# Patient Record
Sex: Female | Born: 1955
Health system: Southern US, Community
[De-identification: ages and names within clinical notes are randomized; demographics above are authoritative.]

## PROBLEM LIST (undated history)

## (undated) DIAGNOSIS — K219 Gastro-esophageal reflux disease without esophagitis: Secondary | ICD-10-CM

## (undated) DIAGNOSIS — T7840XA Allergy, unspecified, initial encounter: Secondary | ICD-10-CM

## (undated) HISTORY — DX: Allergy, unspecified, initial encounter: T78.40XA

## (undated) HISTORY — PX: TUBAL LIGATION: SHX77

## (undated) HISTORY — PX: JOINT REPLACEMENT: SHX530

## (undated) HISTORY — PX: TONSILLECTOMY: SUR1361

## (undated) HISTORY — DX: Gastro-esophageal reflux disease without esophagitis: K21.9

---

## 2001-07-17 ENCOUNTER — Other Ambulatory Visit: Admission: RE | Admit: 2001-07-17 | Discharge: 2001-07-17 | Payer: Self-pay | Admitting: Obstetrics & Gynecology

## 2002-10-09 ENCOUNTER — Other Ambulatory Visit: Admission: RE | Admit: 2002-10-09 | Discharge: 2002-10-09 | Payer: Self-pay | Admitting: Obstetrics & Gynecology

## 2003-10-15 ENCOUNTER — Other Ambulatory Visit: Admission: RE | Admit: 2003-10-15 | Discharge: 2003-10-15 | Payer: Self-pay | Admitting: Obstetrics & Gynecology

## 2004-11-11 ENCOUNTER — Other Ambulatory Visit: Admission: RE | Admit: 2004-11-11 | Discharge: 2004-11-11 | Payer: Self-pay | Admitting: Obstetrics & Gynecology

## 2005-08-16 ENCOUNTER — Encounter: Admission: RE | Admit: 2005-08-16 | Discharge: 2005-08-16 | Payer: Self-pay | Admitting: Otolaryngology

## 2005-08-23 ENCOUNTER — Ambulatory Visit (HOSPITAL_COMMUNITY): Admission: RE | Admit: 2005-08-23 | Discharge: 2005-08-23 | Payer: Self-pay | Admitting: Otolaryngology

## 2005-12-02 ENCOUNTER — Encounter: Admission: RE | Admit: 2005-12-02 | Discharge: 2005-12-02 | Payer: Self-pay | Admitting: Family Medicine

## 2008-07-16 ENCOUNTER — Encounter: Admission: RE | Admit: 2008-07-16 | Discharge: 2008-07-16 | Payer: Self-pay | Admitting: Family Medicine

## 2011-10-18 ENCOUNTER — Telehealth: Payer: Self-pay

## 2011-10-18 NOTE — Telephone Encounter (Signed)
Pt has has diarrhea since Saturday and vomiting, vomiting has went away, she would like to see if Dr Tracy Hardy could call her a rx pharmacy is [facility] on [address]

## 2011-10-18 NOTE — Telephone Encounter (Signed)
CALLED PT AND INFORMED HER THAT WE WOULD HAVE TO SEE HER IN THE OFFICE BEFORE WE CAN RX ANY MEDICINE. PT UNDERSTOOD.

## 2011-10-19 ENCOUNTER — Telehealth: Payer: Self-pay

## 2011-10-19 ENCOUNTER — Ambulatory Visit (INDEPENDENT_AMBULATORY_CARE_PROVIDER_SITE_OTHER): Payer: BC Managed Care – PPO | Admitting: Family Medicine

## 2011-10-19 VITALS — BP 113/90 | HR 100 | Temp 98.7°F | Resp 18 | Ht 65.0 in | Wt 165.0 lb

## 2011-10-19 DIAGNOSIS — E860 Dehydration: Secondary | ICD-10-CM

## 2011-10-19 DIAGNOSIS — R197 Diarrhea, unspecified: Secondary | ICD-10-CM

## 2011-10-19 LAB — POCT CBC
Granulocyte percent: 68.8 %G (ref 37–80)
HCT, POC: 41.5 % (ref 37.7–47.9)
Hemoglobin: 14.1 g/dL (ref 12.2–16.2)
Lymph, poc: 1 (ref 0.6–3.4)
MCH, POC: 31.1 pg (ref 27–31.2)
MCHC: 34 g/dL (ref 31.8–35.4)
MCV: 91.3 fL (ref 80–97)
MID (cbc): 0.3 (ref 0–0.9)
MPV: 9.1 fL (ref 0–99.8)
POC Granulocyte: 3 (ref 2–6.9)
POC LYMPH PERCENT: 23.6 %L (ref 10–50)
POC MID %: 7.6 %M (ref 0–12)
Platelet Count, POC: 198 10*3/uL (ref 142–424)
RBC: 4.54 M/uL (ref 4.04–5.48)
RDW, POC: 13.5 %
WBC: 4.3 10*3/uL — AB (ref 4.6–10.2)

## 2011-10-19 LAB — IFOBT (OCCULT BLOOD): IFOBT: NEGATIVE

## 2011-10-19 MED ORDER — ONDANSETRON 4 MG PO TBDP
4.0000 mg | ORAL_TABLET | Freq: Once | ORAL | Status: AC
  Administered 2011-10-19: 4 mg via ORAL

## 2011-10-19 NOTE — Progress Notes (Signed)
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 Began last Saturday with vomiting. She's had persistent diarrhea since. Everything she eats she has loose stool. Last time she went to the bathroom was about an hour before coming. No blood in her stool. Denies cramps. Denies abdominal pain.  Objective: Patient looks acutely ill, Haggard, exhausted  HEENT: Unremarkable  Skin: Some tenting on the backs of her hands. No joint Heart: Regular no murmur  Chest: Clear to auscultation  Abdomen: Hyperactive bowel sounds, no HSM, and nontender, no masses   Orthostatic signs were done and show a 10 point drop in systolic pressure from sitting to standing with a corresponding 10 point increase in pulse. Results for orders placed in visit on 10/19/11  POCT CBC      Component Value Range   WBC 4.3 (*) 4.6 - 10.2 (K/uL)   Lymph, poc 1.0  0.6 - 3.4    POC LYMPH PERCENT 23.6  10 - 50 (%L)   MID (cbc) 0.3  0 - 0.9    POC MID % 7.6  0 - 12 (%M)   POC Granulocyte 3.0  2 - 6.9    Granulocyte percent 68.8  37 - 80 (%G)   RBC 4.54  4.04 - 5.48 (M/uL)   Hemoglobin 14.1  12.2 - 16.2 (g/dL)   HCT, POC 47.8  29.5 - 47.9 (%)   MCV 91.3  80 - 97 (fL)   MCH, POC 31.1  27 - 31.2 (pg)   MCHC 34.0  31.8 - 35.4 (g/dL)   RDW, POC 62.1     Platelet Count, POC 198  142 - 424 (K/uL)   MPV 9.1  0 - 99.8 (fL)  IFOBT (OCCULT BLOOD)      Component Value Range   IFOBT Negative     Assessment: Dehydration with probable neurovirus  Plan: Clear liquids and Zofran

## 2011-10-19 NOTE — Telephone Encounter (Signed)
Pt states the immodium is not working, was treated this morning Needs something for diarrhea.  States she is drinking plenty of water.

## 2011-10-19 NOTE — Patient Instructions (Signed)
Diarrhea Infections caused by germs (bacterial) or a virus commonly cause diarrhea. Your caregiver has determined that with time, rest and fluids, the diarrhea should improve. In general, eat normally while drinking more water than usual. Although water may prevent dehydration, it does not contain salt and minerals (electrolytes). Broths, weak tea without caffeine and oral rehydration solutions (ORS) replace fluids and electrolytes. Small amounts of fluids should be taken frequently. Large amounts at one time may not be tolerated. Plain water may be harmful in infants and the elderly. Oral rehydrating solutions (ORS) are available at pharmacies and grocery stores. ORS replace water and important electrolytes in proper proportions. Sports drinks are not as effective as ORS and may be harmful due to sugars worsening diarrhea.  ORS is especially recommended for use in children with diarrhea. As a general guideline for children, replace any new fluid losses from diarrhea and/or vomiting with ORS as follows:   If your child weighs 22 pounds or under (10 kg or less), give 60-120 mL ( -  cup or 2 - 4 ounces) of ORS for each episode of diarrheal stool or vomiting episode.   If your child weighs more than 22 pounds (more than 10 kgs), give 120-240 mL ( - 1 cup or 4 - 8 ounces) of ORS for each diarrheal stool or episode of vomiting.   While correcting for dehydration, children should eat normally. However, foods high in sugar should be avoided because this may worsen diarrhea. Large amounts of carbonated soft drinks, juice, gelatin desserts and other highly sugared drinks should be avoided.   After correction of dehydration, other liquids that are appealing to the child may be added. Children should drink small amounts of fluids frequently and fluids should be increased as tolerated. Children should drink enough fluids to keep urine clear or pale yellow.   Adults should eat normally while drinking more fluids  than usual. Drink small amounts of fluids frequently and increase as tolerated. Drink enough fluids to keep urine clear or pale yellow. Broths, weak decaffeinated tea, lemon lime soft drinks (allowed to go flat) and ORS replace fluids and electrolytes.   Avoid:   Carbonated drinks.   Juice.   Extremely hot or cold fluids.   Caffeine drinks.   Fatty, greasy foods.   Alcohol.   Tobacco.   Too much intake of anything at one time.   Gelatin desserts.   Probiotics are active cultures of beneficial bacteria. They may lessen the amount and number of diarrheal stools in adults. Probiotics can be found in yogurt with active cultures and in supplements.   Wash hands well to avoid spreading bacteria and virus.   Anti-diarrheal medications are not recommended for infants and children.   Only take over-the-counter or prescription medicines for pain, discomfort or fever as directed by your caregiver. Do not give aspirin to children because it may cause Reye's Syndrome.   For adults, ask your caregiver if you should continue all prescribed and over-the-counter medicines.   If your caregiver has given you a follow-up appointment, it is very important to keep that appointment. Not keeping the appointment could result in a chronic or permanent injury, and disability. If there is any problem keeping the appointment, you must call back to this facility for assistance.  SEEK IMMEDIATE MEDICAL CARE IF:   You or your child is unable to keep fluids down or other symptoms or problems become worse in spite of treatment.   Vomiting or diarrhea develops and becomes persistent.     There is vomiting of blood or bile (green material).   There is blood in the stool or the stools are black and tarry.   There is no urine output in 6-8 hours or there is only a small amount of very dark urine.   Abdominal pain develops, increases or localizes.   You have a fever.   Your baby is older than 3 months with a  rectal temperature of 102 F (38.9 C) or higher.   Your baby is 3 months old or younger with a rectal temperature of 100.4 F (38 C) or higher.   You or your child develops excessive weakness, dizziness, fainting or extreme thirst.   You or your child develops a rash, stiff neck, severe headache or become irritable or sleepy and difficult to awaken.  MAKE SURE YOU:   Understand these instructions.   Will watch your condition.   Will get help right away if you are not doing well or get worse.  Document Released: 06/24/2002 Document Revised: 06/23/2011 Document Reviewed: 05/11/2009 Exit[employer] Patient Information 2012 [employer]. 

## 2011-10-20 NOTE — Telephone Encounter (Signed)
I would give it 24 more hours with the immodium, once she starts to eat more solid foods her stool should start to get more formed.  If not would recommend pt RTC for stool cultures and samples.

## 2011-10-20 NOTE — Telephone Encounter (Signed)
Spoke with patient and she is doing much better.  If symptoms return, she will RTC for stool cultures.

## 2012-03-30 ENCOUNTER — Ambulatory Visit (INDEPENDENT_AMBULATORY_CARE_PROVIDER_SITE_OTHER): Payer: BC Managed Care – PPO | Admitting: Family Medicine

## 2012-03-30 ENCOUNTER — Encounter: Payer: Self-pay | Admitting: Family Medicine

## 2012-03-30 VITALS — BP 128/80 | HR 62 | Temp 98.5°F | Resp 16 | Ht 65.0 in | Wt 176.0 lb

## 2012-03-30 DIAGNOSIS — F39 Unspecified mood [affective] disorder: Secondary | ICD-10-CM

## 2012-03-30 DIAGNOSIS — R0981 Nasal congestion: Secondary | ICD-10-CM

## 2012-03-30 DIAGNOSIS — J3489 Other specified disorders of nose and nasal sinuses: Secondary | ICD-10-CM

## 2012-03-30 DIAGNOSIS — K219 Gastro-esophageal reflux disease without esophagitis: Secondary | ICD-10-CM

## 2012-03-30 DIAGNOSIS — R5381 Other malaise: Secondary | ICD-10-CM

## 2012-03-30 DIAGNOSIS — Z0000 Encounter for general adult medical examination without abnormal findings: Secondary | ICD-10-CM

## 2012-03-30 DIAGNOSIS — Z23 Encounter for immunization: Secondary | ICD-10-CM

## 2012-03-30 DIAGNOSIS — R5383 Other fatigue: Secondary | ICD-10-CM

## 2012-03-30 LAB — COMPREHENSIVE METABOLIC PANEL
CO2: 28 mEq/L (ref 19–32)
Chloride: 105 mEq/L (ref 96–112)
Creat: 0.87 mg/dL (ref 0.50–1.10)
Glucose, Bld: 76 mg/dL (ref 70–99)
Total Bilirubin: 0.4 mg/dL (ref 0.3–1.2)

## 2012-03-30 LAB — LIPID PANEL: LDL Cholesterol: 132 mg/dL — ABNORMAL HIGH (ref 0–99)

## 2012-03-30 LAB — TSH: TSH: 1.492 u[IU]/mL (ref 0.350–4.500)

## 2012-03-30 MED ORDER — FLUTICASONE PROPIONATE 50 MCG/ACT NA SUSP: 2.0000 | Freq: Every day | NASAL | Status: DC

## 2012-03-30 MED ORDER — OMEPRAZOLE 40 MG PO CPDR: 40.0000 mg | DELAYED_RELEASE_CAPSULE | Freq: Every day | ORAL | Status: DC

## 2012-03-30 MED ORDER — LEVOCETIRIZINE DIHYDROCHLORIDE 5 MG PO TABS: 5.0000 mg | ORAL_TABLET | Freq: Every evening | ORAL | Status: DC

## 2012-03-30 NOTE — Progress Notes (Signed)
  Subjective:    Patient ID: Tracy Hardy, female    DOB: [birth year], 56 y.o.   MRN: [id number]  HPI Seeing gynecologist Dr. Arlyce Dice regularly for pap smears (no longer needing yrly), annual breast exams/mammograms, and Hardy anxiety medications (xanax).  Had a colonoscopy last yr and was nml - none further till 2022. Taking flonase but not regularly but still w/ a ton of sinus congestion and rhonirrhea. Feels like she is going to get sick.l   Review of Systems  Constitutional: Positive for diaphoresis and fatigue. Negative for fever, chills, activity change, appetite change and unexpected weight change.  HENT: Positive for postnasal drip and sinus pressure. Negative for hearing loss, ear pain, nosebleeds, congestion, sore throat, facial swelling, rhinorrhea, sneezing, drooling, mouth sores, trouble swallowing, neck pain, neck stiffness, dental problem, voice change, tinnitus and ear discharge.   Eyes: Positive for itching. Negative for photophobia, pain, discharge, redness and visual disturbance.  Respiratory: Positive for cough. Negative for apnea, choking, chest tightness, shortness of breath, wheezing and stridor.   Cardiovascular: Negative.   Gastrointestinal: Negative.   Genitourinary: Positive for flank pain. Negative for dysuria, urgency, frequency, hematuria, decreased urine volume, vaginal bleeding, vaginal discharge, enuresis, difficulty urinating, genital sores, vaginal pain, menstrual problem, pelvic pain and dyspareunia.  Musculoskeletal: Negative.   Skin: Negative.   Neurological: Positive for light-headedness and headaches. Negative for dizziness, tremors, seizures, syncope, facial asymmetry, speech difficulty, weakness and numbness.  Hematological: Negative for adenopathy. Bruises/bleeds easily.  Psychiatric/Behavioral: Negative.        Objective:   Physical Exam  Vitals reviewed. Constitutional: She is oriented to person, place, and time. She appears well-developed and  well-nourished. No distress.  HENT:  Head: Normocephalic and atraumatic.  Right Ear: External ear and ear canal normal. Tympanic membrane is retracted.  Left Ear: External ear and ear canal normal. Tympanic membrane is retracted.  Nose: Nose normal.  Mouth/Throat: Oropharynx is clear and moist. No oropharyngeal exudate.  Eyes: Conjunctivae normal and EOM are normal. Pupils are equal, round, and reactive to light. Right eye exhibits no discharge. Left eye exhibits no discharge. No scleral icterus.  Fundoscopic exam:      The right eye shows no arteriolar narrowing, no AV nicking and no papilledema.       The left eye shows no arteriolar narrowing, no AV nicking and no papilledema.  Neck: Normal range of motion. Neck supple. No thyromegaly present.  Cardiovascular: Normal rate, regular rhythm, normal heart sounds and intact distal pulses.   Pulmonary/Chest: Effort normal and breath sounds normal. No respiratory distress. She has no wheezes.  Abdominal: Soft. Bowel sounds are normal. She exhibits no distension and no mass. There is no tenderness.  Musculoskeletal: She exhibits no edema and no tenderness.  Lymphadenopathy:    She has no cervical adenopathy.  Neurological: She is alert and oriented to person, place, and time. She has normal reflexes. No cranial nerve deficit.  Skin: Skin is warm and dry. No rash noted. She is not diaphoretic.  Psychiatric: She has a normal mood and affect. Hardy behavior is normal.          Assessment & Plan:  1. Health Maintenance - reviewed importance of exercise, diet, ca/vit D. Check labs. tdap today. Mammogram, pap, colonoscopy UTD. 2. gerd - refill ppi 3. Sinus cong - cont flonase - reviewed use, start oral antihistamine.

## 2012-03-30 NOTE — Patient Instructions (Addendum)
Supplement with calcium citrate, NOT calcium carbonate - examples of what you could use are Caltrate or Citracal. Add in an addition 400-600mg  of calcium daily to your diet plus 400u of vitamin D in addition to weight bearing exercise.

## 2012-04-01 NOTE — Progress Notes (Signed)
Reviewed and agree.

## 2013-05-14 ENCOUNTER — Other Ambulatory Visit: Payer: Self-pay | Admitting: Family Medicine

## 2013-05-14 DIAGNOSIS — E041 Nontoxic single thyroid nodule: Secondary | ICD-10-CM

## 2013-05-15 ENCOUNTER — Ambulatory Visit
Admission: RE | Admit: 2013-05-15 | Discharge: 2013-05-15 | Disposition: A | Discharge status: Home / Self Care | Payer: BC Managed Care – PPO | Source: Ambulatory Visit | Attending: Family Medicine | Admitting: Family Medicine

## 2013-05-15 ENCOUNTER — Other Ambulatory Visit: Payer: Self-pay | Admitting: Family Medicine

## 2013-05-15 DIAGNOSIS — E041 Nontoxic single thyroid nodule: Secondary | ICD-10-CM

## 2014-02-21 ENCOUNTER — Ambulatory Visit (INDEPENDENT_AMBULATORY_CARE_PROVIDER_SITE_OTHER): Payer: BC Managed Care – PPO

## 2014-02-21 ENCOUNTER — Ambulatory Visit (INDEPENDENT_AMBULATORY_CARE_PROVIDER_SITE_OTHER): Payer: BC Managed Care – PPO | Admitting: Family Medicine

## 2014-02-21 ENCOUNTER — Telehealth: Payer: Self-pay | Admitting: *Deleted

## 2014-02-21 VITALS — BP 120/72 | HR 61 | Temp 98.1°F | Resp 18 | Ht 65.0 in | Wt 178.0 lb

## 2014-02-21 DIAGNOSIS — J0110 Acute frontal sinusitis, unspecified: Secondary | ICD-10-CM

## 2014-02-21 DIAGNOSIS — R05 Cough: Secondary | ICD-10-CM

## 2014-02-21 DIAGNOSIS — R059 Cough, unspecified: Secondary | ICD-10-CM

## 2014-02-21 DIAGNOSIS — J209 Acute bronchitis, unspecified: Secondary | ICD-10-CM

## 2014-02-21 MED ORDER — AZITHROMYCIN 250 MG PO TABS: ORAL_TABLET | ORAL | Status: DC

## 2014-02-21 MED ORDER — HYDROCOD POLST-CHLORPHEN POLST 10-8 MG/5ML PO LQCR: 5.0000 mL | Freq: Two times a day (BID) | ORAL | Status: DC | PRN

## 2014-02-21 MED ORDER — BENZONATATE 100 MG PO CAPS: 200.0000 mg | ORAL_CAPSULE | Freq: Two times a day (BID) | ORAL | Status: DC | PRN

## 2014-02-21 NOTE — Patient Instructions (Signed)
Sinusitis Sinusitis is redness, soreness, and inflammation of the paranasal sinuses. Paranasal sinuses are air pockets within the bones of your face (beneath the eyes, the middle of the forehead, or above the eyes). In healthy paranasal sinuses, mucus is able to drain out, and air is able to circulate through them by way of your nose. However, when your paranasal sinuses are inflamed, mucus and air can become trapped. This can allow bacteria and other germs to grow and cause infection. Sinusitis can develop quickly and last only a short time (acute) or continue over a long period (chronic). Sinusitis that lasts for more than 12 weeks is considered chronic.  CAUSES  Causes of sinusitis include:  Allergies.  Structural abnormalities, such as displacement of the cartilage that separates your nostrils (deviated septum), which can decrease the air flow through your nose and sinuses and affect sinus drainage.  Functional abnormalities, such as when the small hairs (cilia) that line your sinuses and help remove mucus do not work properly or are not present. SIGNS AND SYMPTOMS  Symptoms of acute and chronic sinusitis are the same. The primary symptoms are pain and pressure around the affected sinuses. Other symptoms include:  Upper toothache.  Earache.  Headache.  Bad breath.  Decreased sense of smell and taste.  A cough, which worsens when you are lying flat.  Fatigue.  Fever.  Thick drainage from your nose, which often is green and may contain pus (purulent).  Swelling and warmth over the affected sinuses. DIAGNOSIS  Your health care provider will perform a physical exam. During the exam, your health care provider may:  Look in your nose for signs of abnormal growths in your nostrils (nasal polyps).  Tap over the affected sinus to check for signs of infection.  View the inside of your sinuses (endoscopy) using an imaging device that has a light attached (endoscope). If your health  care provider suspects that you have chronic sinusitis, one or more of the following tests may be recommended:  Allergy tests.  Nasal culture. A sample of mucus is taken from your nose, sent to a lab, and screened for bacteria.  Nasal cytology. A sample of mucus is taken from your nose and examined by your health care provider to determine if your sinusitis is related to an allergy. TREATMENT  Most cases of acute sinusitis are related to a viral infection and will resolve on their own within 10 days. Sometimes medicines are prescribed to help relieve symptoms (pain medicine, decongestants, nasal steroid sprays, or saline sprays).  However, for sinusitis related to a bacterial infection, your health care provider will prescribe antibiotic medicines. These are medicines that will help kill the bacteria causing the infection.  Rarely, sinusitis is caused by a fungal infection. In theses cases, your health care provider will prescribe antifungal medicine. For some cases of chronic sinusitis, surgery is needed. Generally, these are cases in which sinusitis recurs more than 3 times per year, despite other treatments. HOME CARE INSTRUCTIONS   Drink plenty of water. Water helps thin the mucus so your sinuses can drain more easily.  Use a humidifier.  Inhale steam 3 to 4 times a day (for example, sit in the bathroom with the shower running).  Apply a warm, moist washcloth to your face 3 to 4 times a day, or as directed by your health care provider.  Use saline nasal sprays to help moisten and clean your sinuses.  Take medicines only as directed by your health care provider.    If you were prescribed either an antibiotic or antifungal medicine, finish it all even if you start to feel better. °SEEK IMMEDIATE MEDICAL CARE IF: °· You have increasing pain or severe headaches. °· You have nausea, vomiting, or drowsiness. °· You have swelling around your face. °· You have vision problems. °· You have a stiff  neck. °· You have difficulty breathing. °MAKE SURE YOU:  °· Understand these instructions. °· Will watch your condition. °· Will get help right away if you are not doing well or get worse. °Document Released: 07/04/2005 Document Revised: 11/18/2013 Document Reviewed: 07/19/2011 °[employer]® Patient Information ©2015 [employer]. This information is not intended to replace advice given to you by your health care provider. Make sure you discuss any questions you have with your health care provider. °Acute Bronchitis °Bronchitis is inflammation of the airways that extend from the windpipe into the lungs (bronchi). The inflammation often causes mucus to develop. This leads to a cough, which is the most common symptom of bronchitis.  °In acute bronchitis, the condition usually develops suddenly and goes away over time, usually in a couple weeks. Smoking, allergies, and asthma can make bronchitis worse. Repeated episodes of bronchitis may cause further lung problems.  °CAUSES °Acute bronchitis is most often caused by the same virus that causes a cold. The virus can spread from person to person (contagious) through coughing, sneezing, and touching contaminated objects. °SIGNS AND SYMPTOMS  °· Cough.   °· Fever.   °· Coughing up mucus.   °· Body aches.   °· Chest congestion.   °· Chills.   °· Shortness of breath.   °· Sore throat.   °DIAGNOSIS  °Acute bronchitis is usually diagnosed through a physical exam. Your health care provider will also ask you questions about your medical history. Tests, such as chest X-rays, are sometimes done to rule out other conditions.  °TREATMENT  °Acute bronchitis usually goes away in a couple weeks. Oftentimes, no medical treatment is necessary. Medicines are sometimes given for relief of fever or cough. Antibiotic medicines are usually not needed but may be prescribed in certain situations. In some cases, an inhaler may be recommended to help reduce shortness of breath and control the cough.  A cool mist vaporizer may also be used to help thin bronchial secretions and make it easier to clear the chest.  °HOME CARE INSTRUCTIONS °· Get plenty of rest.   °· Drink enough fluids to keep your urine clear or pale yellow (unless you have a medical condition that requires fluid restriction). Increasing fluids may help thin your respiratory secretions (sputum) and reduce chest congestion, and it will prevent dehydration.   °· Take medicines only as directed by your health care provider. °· If you were prescribed an antibiotic medicine, finish it all even if you start to feel better. °· Avoid smoking and secondhand smoke. Exposure to cigarette smoke or irritating chemicals will make bronchitis worse. If you are a smoker, consider using nicotine gum or skin patches to help control withdrawal symptoms. Quitting smoking will help your lungs heal faster.   °· Reduce the chances of another bout of acute bronchitis by washing your hands frequently, avoiding people with cold symptoms, and trying not to touch your hands to your mouth, nose, or eyes.   °· Keep all follow-up visits as directed by your health care provider.   °SEEK MEDICAL CARE IF: °Your symptoms do not improve after 1 week of treatment.  °SEEK IMMEDIATE MEDICAL CARE IF: °· You develop an increased fever or chills.   °· You have chest pain.   °·   You have severe shortness of breath. °· You have bloody sputum.   °· You develop dehydration. °· You faint or repeatedly feel like you are going to pass out. °· You develop repeated vomiting. °· You develop a severe headache. °MAKE SURE YOU:  °· Understand these instructions. °· Will watch your condition. °· Will get help right away if you are not doing well or get worse. °Document Released: 08/11/2004 Document Revised: 11/18/2013 Document Reviewed: 12/25/2012 °[employer]® Patient Information ©2015 [employer]. This information is not intended to replace advice given to you by your health care provider. Make sure you  discuss any questions you have with your health care provider. ° °

## 2014-02-21 NOTE — Telephone Encounter (Signed)
Pt called about rx's sent to pharmacy. Rx's were sent to pharmacy, but pharmacy did not receive them. I called the pharmacy and verbally gave them the rx's.

## 2014-02-21 NOTE — Progress Notes (Signed)
 Chief Complaint:  Chief Complaint  Patient presents with  . Cough    started the end of may has been taking mucinex and mucinex d but getting worse  . head congestion    HPI: Tracy Hardy is a 58 y.o. female who is here for  [place], flonase, and has tried mucinex D, advil. She does have a history of allergies.  Has had HA and cough, has tried delsum. Cough is clear prodcution No recent travels, [place] out of the country. No othe sick contact. No ear pain, +facial pain Has had a history of PNA.   Past Medical History  Diagnosis Date  . Allergy    Past Surgical History  Procedure Laterality Date  . Cesarean section    . Tubal ligation     History   Social History  . Marital Status: Single    Spouse Name: N/A    Number of Children: N/A  . Years of Education: N/A   Social History Main Topics  . Smoking status: Former Research scientist (life sciences)  . Smokeless tobacco: None  . Alcohol Use: 0.6 oz/week    1 Cans of beer per week  . Drug Use: No  . Sexual Activity: Yes    Birth Control/ Protection: Surgical     Comment: married, monogamous   Other Topics Concern  . None   Social History Narrative  . None   Family History  Problem Relation Age of Onset  . Hypertension Mother    Allergies  Allergen Reactions  . Codeine    Prior to Admission medications   Medication Sig Start Date End Date Taking? Authorizing Provider  ALPRAZolam Duanne Moron) 0.5 MG tablet Take 0.5 mg by mouth at bedtime as needed.   Yes Historical Provider, MD  Cranberry 400 MG TABS Take by mouth.   Yes Historical Provider, MD  Fish Oil OIL 1,200 mg by Does not apply route.   Yes Historical Provider, MD  fluticasone (FLONASE) 50 MCG/ACT nasal spray Place 2 sprays into the nose at bedtime. 03/30/12  Yes Shawnee Knapp, MD  levocetirizine (XYZAL) 5 MG tablet Take 1 tablet (5 mg total) by mouth every evening. 03/30/12 02/21/14 Yes Shawnee Knapp, MD  Multiple Vitamin (MULTIVITAMIN) tablet Take 1  tablet by mouth daily.   Yes Historical Provider, MD  omeprazole (PRILOSEC) 40 MG capsule Take 1 capsule (40 mg total) by mouth daily. 03/30/12  Yes Shawnee Knapp, MD  PARoxetine (PAXIL) 10 MG tablet Take 10 mg by mouth daily.   Yes Historical Provider, MD  FINACEA 15 % cream  03/21/12   Historical Provider, MD  FLUoxetine (PROZAC) 20 MG capsule Take 20 mg by mouth daily.    Historical Provider, MD     ROS: The patient denies fevers, chills, night sweats, unintentional weight loss, chest pain, palpitations, wheezing, dyspnea on exertion, nausea, vomiting, abdominal pain, dysuria, hematuria, melena, numbness, weakness, or tingling  All other systems have been reviewed and were otherwise negative with the exception of those mentioned in the HPI and as above.    PHYSICAL EXAM: Filed Vitals:   02/21/14 1200  BP: 120/72  Pulse: 61  Temp: 98.1 F (36.7 C)  Resp: 18   Filed Vitals:   02/21/14 1200  Height: 5\' 5"  (1.651 m)  Weight: 178 lb (80.74 kg)   Body mass index is 29.62 kg/(m^2).  General: Alert, no acute distress HEENT:  Normocephalic, atraumatic, oropharynx patent. EOMI, PERRLA, TM  nl, + sinus tenderness, erythem throat, no exudates Cardiovascular:  Regular rate and rhythm, no rubs murmurs or gallops.  No Carotid bruits, radial pulse intact. No pedal edema.  Respiratory: Clear to auscultation bilaterally.  No wheezes, rales, or rhonchi.  No cyanosis, no use of accessory musculature GI: No organomegaly, abdomen is soft and non-tender, positive bowel sounds.  No masses. Skin: No rashes. Neurologic: Facial musculature symmetric. Psychiatric: Patient is appropriate throughout our interaction. Lymphatic: No cervical lymphadenopathy Musculoskeletal: Gait intact.   LABS: Results for orders placed in visit on 03/30/12  LIPID PANEL      Result Value Ref Range   Cholesterol 199  0 - 200 mg/dL   Triglycerides 108  <150 mg/dL   HDL 45  >39 mg/dL   Total CHOL/HDL Ratio 4.4     VLDL 22  0  - 40 mg/dL   LDL Cholesterol 132 (*) 0 - 99 mg/dL  COMPREHENSIVE METABOLIC PANEL      Result Value Ref Range   Sodium 137  135 - 145 mEq/L   Potassium 4.1  3.5 - 5.3 mEq/L   Chloride 105  96 - 112 mEq/L   CO2 28  19 - 32 mEq/L   Glucose, Bld 76  70 - 99 mg/dL   BUN 18  6 - 23 mg/dL   Creat 0.87  0.50 - 1.10 mg/dL   Total Bilirubin 0.4  0.3 - 1.2 mg/dL   Alkaline Phosphatase 61  39 - 117 U/L   AST 17  0 - 37 U/L   ALT 17  0 - 35 U/L   Total Protein 6.8  6.0 - 8.3 g/dL   Albumin 4.4  3.5 - 5.2 g/dL   Calcium 9.2  8.4 - 10.5 mg/dL  TSH      Result Value Ref Range   TSH 1.492  0.350 - 4.500 uIU/mL     EKG/XRAY:   Primary read interpreted by Dr. Marin Comment at [facility]. Increase streaking and possible infiltrate on Right middle/lower lobe vs increase vascular markings vs scarring She has had a hisotry on PNA in the past    ASSESSMENT/PLAN: Encounter Diagnoses  Name Primary?  . Cough   . Acute frontal sinusitis, recurrence not specified Yes  . Acute bronchitis, unspecified organism    Otc meds for allergies Rx Tussionex, Tessalon pErles and also  Pack F/u prn  Gross sideeffects, risk and benefits, and alternatives of medications d/w patient. Patient is aware that all medications have potential sideeffects and we are unable to predict every sideeffect or drug-drug interaction that may occur.  , [place], DO 02/25/2014 8:31 AM

## 2014-06-13 ENCOUNTER — Encounter: Payer: Self-pay | Admitting: Family Medicine

## 2014-06-13 ENCOUNTER — Ambulatory Visit (INDEPENDENT_AMBULATORY_CARE_PROVIDER_SITE_OTHER): Payer: BC Managed Care – PPO | Admitting: Family Medicine

## 2014-06-13 VITALS — BP 126/83 | HR 52 | Temp 97.6°F | Resp 16 | Ht 65.0 in | Wt 178.2 lb

## 2014-06-13 DIAGNOSIS — Z0000 Encounter for general adult medical examination without abnormal findings: Secondary | ICD-10-CM

## 2014-06-13 DIAGNOSIS — Z13220 Encounter for screening for lipoid disorders: Secondary | ICD-10-CM

## 2014-06-13 DIAGNOSIS — N390 Urinary tract infection, site not specified: Secondary | ICD-10-CM

## 2014-06-13 DIAGNOSIS — L600 Ingrowing nail: Secondary | ICD-10-CM

## 2014-06-13 DIAGNOSIS — Z1329 Encounter for screening for other suspected endocrine disorder: Secondary | ICD-10-CM

## 2014-06-13 DIAGNOSIS — Z130 Encounter for screening for diseases of the blood and blood-forming organs and certain disorders involving the immune mechanism: Secondary | ICD-10-CM

## 2014-06-13 DIAGNOSIS — R350 Frequency of micturition: Secondary | ICD-10-CM

## 2014-06-13 LAB — POCT URINALYSIS DIPSTICK
Bilirubin, UA: NEGATIVE
Glucose, UA: NEGATIVE
Ketones, UA: NEGATIVE
Nitrite, UA: NEGATIVE
Protein, UA: NEGATIVE
Spec Grav, UA: 1.01
Urobilinogen, UA: 0.2
pH, UA: 6.5

## 2014-06-13 LAB — POCT UA - MICROSCOPIC ONLY
Casts, Ur, LPF, POC: NEGATIVE
Crystals, Ur, HPF, POC: NEGATIVE
RBC, urine, microscopic: NEGATIVE
Yeast, UA: NEGATIVE

## 2014-06-13 MED ORDER — FLUTICASONE PROPIONATE 50 MCG/ACT NA SUSP: 2.0000 | Freq: Every day | NASAL | Status: DC

## 2014-06-13 MED ORDER — LEVOCETIRIZINE DIHYDROCHLORIDE 5 MG PO TABS: 5.0000 mg | ORAL_TABLET | Freq: Every evening | ORAL | Status: DC

## 2014-06-13 MED ORDER — CEPHALEXIN 500 MG PO CAPS: 500.0000 mg | ORAL_CAPSULE | Freq: Two times a day (BID) | ORAL | Status: DC

## 2014-06-13 NOTE — Progress Notes (Signed)
 Chief Complaint:  Chief Complaint  Patient presents with  . Annual Exam  . Back Pain    lower back pain, thinks she has a uti    HPI: Tracy Hardy is a 58 y.o. female who is here fo annual without pap  Seeing gynecologist Dr. Deatra Ina regularly for pap smears (no longer needing yrly) last year was normal, annual breast exams/mammograms Had a colonoscopy last yr and was nml - none further till 2022. Has UTI sxs, increas frequency with lower back pain UTD on Flu vaccine UTD on colonscopy UTD on mammogram   Past Medical History  Diagnosis Date  . Allergy   . GERD (gastroesophageal reflux disease)    Past Surgical History  Procedure Laterality Date  . Cesarean section    . Tubal ligation     History   Social History  . Marital Status: Single    Spouse Name: N/A    Number of Children: N/A  . Years of Education: N/A   Social History Main Topics  . Smoking status: Former Research scientist (life sciences)  . Smokeless tobacco: None  . Alcohol Use: 0.6 oz/week    1 Cans of beer per week  . Drug Use: No  . Sexual Activity: Yes    Birth Control/ Protection: Surgical     Comment: married, monogamous   Other Topics Concern  . None   Social History Narrative   Family History  Problem Relation Age of Onset  . Hypertension Mother    Allergies  Allergen Reactions  . Codeine    Prior to Admission medications   Medication Sig Start Date End Date Taking? Authorizing Provider  ALPRAZolam Duanne Moron) 0.5 MG tablet Take 0.5 mg by mouth at bedtime as needed.   Yes Historical Provider, MD  Cranberry 400 MG TABS Take by mouth.   Yes Historical Provider, MD  Fish Oil OIL 1,200 mg by Does not apply route.   Yes Historical Provider, MD  FLUoxetine (PROZAC) 20 MG capsule Take 20 mg by mouth daily.   Yes Historical Provider, MD  fluticasone (FLONASE) 50 MCG/ACT nasal spray Place 2 sprays into the nose at bedtime. 03/30/12  Yes Shawnee Knapp, MD  Multiple Vitamin (MULTIVITAMIN) tablet Take 1 tablet by  mouth daily.   Yes Historical Provider, MD  omeprazole (PRILOSEC) 40 MG capsule Take 1 capsule (40 mg total) by mouth daily. 03/30/12  Yes Shawnee Knapp, MD  PARoxetine (PAXIL) 10 MG tablet Take 10 mg by mouth daily.   Yes Historical Provider, MD  levocetirizine (XYZAL) 5 MG tablet Take 1 tablet (5 mg total) by mouth every evening. 03/30/12 02/21/14  Shawnee Knapp, MD     ROS: The patient denies fevers, chills, night sweats, unintentional weight loss, chest pain, palpitations, wheezing, dyspnea on exertion, nausea, vomiting, abdominal pain, dysuria, hematuria, melena, numbness, weakness, or tingling.   All other systems have been reviewed and were otherwise negative with the exception of those mentioned in the HPI and as above.    PHYSICAL EXAM: Filed Vitals:   06/13/14 0839  BP: 126/83  Pulse: 52  Temp: 97.6 F (36.4 C)  Resp: 16   Filed Vitals:   06/13/14 0839  Height: 5\' 5"  (1.651 m)  Weight: 178 lb 3.2 oz (80.831 kg)   Body mass index is 29.65 kg/(m^2).  General: Alert, no acute distress HEENT:  Normocephalic, atraumatic, oropharynx patent. EOMI, PERRLA, fundo exam and tm normal, no thryoidmegaly Cardiovascular:  Regular rate and rhythm, no rubs  murmurs or gallops.  No Carotid bruits, radial pulse intact. No pedal edema.  Respiratory: Clear to auscultation bilaterally.  No wheezes, rales, or rhonchi.  No cyanosis, no use of accessory musculature GI: No organomegaly, abdomen is soft and non-tender, positive bowel sounds.  No masses. Skin: No rashes. + left toe nail, mayhave banged it and now is growing funny and hurting her, she wants to get it removed  Neurologic: Facial musculature symmetric. Psychiatric: Patient is appropriate throughout our interaction. Lymphatic: No cervical lymphadenopathy Musculoskeletal: Gait intact. 5/5 strength 2/2 DTRS UE and    LABS: Results for orders placed or performed in visit on 06/13/14  POCT UA - Microscopic Only  Result Value Ref Range   WBC,  Ur, HPF, POC 0-2    RBC, urine, microscopic neg    Bacteria, U Microscopic trace    Mucus, UA meg    Epithelial cells, urine per micros 0-1    Crystals, Ur, HPF, POC neg    Casts, Ur, LPF, POC neg    Yeast, UA neg   POCT urinalysis dipstick  Result Value Ref Range   Color, UA yellow    Clarity, UA clear    Glucose, UA neg    Bilirubin, UA neg    Ketones, UA neg    Spec Grav, UA 1.010    Blood, UA trace-intact    pH, UA 6.5    Protein, UA neg    Urobilinogen, UA 0.2    Nitrite, UA neg    Leukocytes, UA small (1+)      EKG/XRAY:   Primary read interpreted by Dr. Marin Comment at [facility].   ASSESSMENT/PLAN: Encounter Diagnoses  Name Primary?  . Urinary frequency   . Annual physical exam Yes  . Screening for deficiency anemia   . Screening for hyperlipidemia   . Screening for thyroid disorder   . UTI (urinary tract infection), uncomplicated    Refer to [facility] for painful toe nail Annual Labs pending Rx Keflex for UTI Urine cx pending F/u annually  Gross sideeffects, risk and benefits, and alternatives of medications d/w patient. Patient is aware that all medications have potential sideeffects and we are unable to predict every sideeffect or drug-drug interaction that may occur.  , [place], DO 06/13/2014 9:31 AM

## 2014-06-14 LAB — CBC WITH DIFFERENTIAL/PLATELET
Basophils Absolute: 0 10*3/uL (ref 0.0–0.2)
Basos: 1 %
Eos: 3 %
Eosinophils Absolute: 0.2 10*3/uL (ref 0.0–0.4)
HCT: 40.9 % (ref 34.0–46.6)
Hemoglobin: 14 g/dL (ref 11.1–15.9)
Immature Grans (Abs): 0 10*3/uL (ref 0.0–0.1)
Immature Granulocytes: 0 %
Lymphocytes Absolute: 1.8 10*3/uL (ref 0.7–3.1)
Lymphs: 37 %
MCH: 31.5 pg (ref 26.6–33.0)
MCHC: 34.2 g/dL (ref 31.5–35.7)
MCV: 92 fL (ref 79–97)
Monocytes Absolute: 0.3 10*3/uL (ref 0.1–0.9)
Monocytes: 6 %
Neutrophils Absolute: 2.5 10*3/uL (ref 1.4–7.0)
Neutrophils Relative %: 53 %
RBC: 4.45 x10E6/uL (ref 3.77–5.28)
RDW: 13.9 % (ref 12.3–15.4)
WBC: 4.8 10*3/uL (ref 3.4–10.8)

## 2014-06-14 LAB — COMPREHENSIVE METABOLIC PANEL WITH GFR
ALT: 13 IU/L (ref 0–32)
AST: 18 IU/L (ref 0–40)
Albumin/Globulin Ratio: 2.2 (ref 1.1–2.5)
Alkaline Phosphatase: 66 IU/L (ref 39–117)
BUN/Creatinine Ratio: 16 (ref 9–23)
Calcium: 8.9 mg/dL (ref 8.7–10.2)
Creatinine, Ser: 0.95 mg/dL (ref 0.57–1.00)
GFR calc non Af Amer: 67 mL/min/{1.73_m2} (ref >59)
Globulin, Total: 2 g/dL (ref 1.5–4.5)
Potassium: 4.2 mmol/L (ref 3.5–5.2)

## 2014-06-14 LAB — LIPID PANEL
Chol/HDL Ratio: 4.3 ratio (ref 0.0–4.4)
Cholesterol, Total: 186 mg/dL (ref 100–199)
HDL: 43 mg/dL (ref >39)
LDL Calculated: 121 mg/dL — ABNORMAL HIGH (ref 0–99)
Triglycerides: 108 mg/dL (ref 0–149)
VLDL Cholesterol Cal: 22 mg/dL (ref 5–40)

## 2014-06-14 LAB — COMPREHENSIVE METABOLIC PANEL
Albumin: 4.3 g/dL (ref 3.5–5.5)
BUN: 15 mg/dL (ref 6–24)
CO2: 26 mmol/L (ref 18–29)
Chloride: 101 mmol/L (ref 97–108)
GFR calc Af Amer: 77 mL/min/{1.73_m2} (ref >59)
Glucose: 89 mg/dL (ref 65–99)
Sodium: 140 mmol/L (ref 134–144)
Total Bilirubin: 0.3 mg/dL (ref 0.0–1.2)
Total Protein: 6.3 g/dL (ref 6.0–8.5)

## 2014-06-14 LAB — TSH: TSH: 1.39 u[IU]/mL (ref 0.450–4.500)

## 2014-06-15 LAB — URINE CULTURE: Colony Count: 7000

## 2014-06-24 ENCOUNTER — Encounter: Payer: Self-pay | Admitting: Family Medicine

## 2014-08-28 LAB — HM MAMMOGRAPHY

## 2014-10-22 ENCOUNTER — Ambulatory Visit (INDEPENDENT_AMBULATORY_CARE_PROVIDER_SITE_OTHER): Payer: BLUE CROSS/BLUE SHIELD

## 2014-10-22 ENCOUNTER — Encounter: Payer: Self-pay | Admitting: Podiatry

## 2014-10-22 ENCOUNTER — Ambulatory Visit (INDEPENDENT_AMBULATORY_CARE_PROVIDER_SITE_OTHER): Payer: BLUE CROSS/BLUE SHIELD | Admitting: Podiatry

## 2014-10-22 VITALS — BP 137/89 | HR 55 | Resp 15

## 2014-10-22 DIAGNOSIS — M2011 Hallux valgus (acquired), right foot: Secondary | ICD-10-CM | POA: Diagnosis not present

## 2014-10-22 DIAGNOSIS — M79671 Pain in right foot: Secondary | ICD-10-CM

## 2014-10-22 DIAGNOSIS — L600 Ingrowing nail: Secondary | ICD-10-CM | POA: Diagnosis not present

## 2014-10-22 DIAGNOSIS — M779 Enthesopathy, unspecified: Secondary | ICD-10-CM | POA: Diagnosis not present

## 2014-10-22 DIAGNOSIS — M21611 Bunion of right foot: Secondary | ICD-10-CM

## 2014-10-22 MED ORDER — TRIAMCINOLONE ACETONIDE 10 MG/ML IJ SUSP
10.0000 mg | Freq: Once | INTRAMUSCULAR | Status: AC
  Administered 2014-10-22: 10 mg

## 2014-10-22 NOTE — Patient Instructions (Signed)

## 2014-10-22 NOTE — Progress Notes (Signed)
   Subjective:    Patient ID: Tracy Hardy, female    DOB: September 15, 1955, 59 y.o.   MRN: [id number]  HPI Pt presents with painful ingrown 5th nail   Review of Systems  All other systems reviewed and are negative.      Objective:   Physical Exam        Assessment & Plan:

## 2014-10-23 NOTE — Progress Notes (Signed)
Subjective:     Patient ID: Tracy Hardy, female   DOB: 1955/10/12, 60 y.o.   MRN: [id number]  HPI patient complains about the left fifth nail on the medial side been very tender and the nail itself being thick and also has structural deformity of the right first metatarsal with redness   Review of Systems  All other systems reviewed and are negative.      Objective:   Physical Exam  Constitutional: She is oriented to person, place, and time.  Cardiovascular: Intact distal pulses.   Musculoskeletal: Normal range of motion.  Neurological: She is oriented to person, place, and time.  Skin: Skin is warm.  Nursing note and vitals reviewed.  neurovascular status found to be intact with muscle strength adequate range of motion within normal limits. Patient's noted to have an incurvated left fifth nail medial side with pain upon palpation and redness and irritation around the right first metatarsal head that is painful with shoe gear. Digits are found to be well-perfused and patient well oriented 3     Assessment:     Ingrown toenail deformity left fifth nail medial side that's painful when pressed with damaged nail overall and bunion deformity right    Plan:     H&P and x-rays reviewed. At this time were to focus on the nail and I went ahead and I infiltrated the left fifth toe 60 mg Xylocaine Marcaine mixture remove the nail exposed the medial side and applied chemical and will allow the rest of the nail to regrow explaining ultimately she may lose the entire nail. Discussed bunion which she is can have fixed in the fall

## 2014-12-22 ENCOUNTER — Telehealth: Payer: Self-pay | Admitting: *Deleted

## 2014-12-22 NOTE — Telephone Encounter (Signed)
Received fax from [facility] for mammo results dated 09/03/2014 - no mammographic signs of malignancy.  Updated health maintenance & added MD to care team.

## 2014-12-22 NOTE — Telephone Encounter (Signed)
Phoned Dr. Kelby Fam office - [facility] & spoke with [place] in med rec & she is faxing copy of 08/28/14 mammo results.  Will update health maintenance once received.

## 2015-05-06 ENCOUNTER — Other Ambulatory Visit: Payer: Self-pay | Admitting: Family Medicine

## 2015-06-13 ENCOUNTER — Ambulatory Visit (INDEPENDENT_AMBULATORY_CARE_PROVIDER_SITE_OTHER): Payer: BLUE CROSS/BLUE SHIELD | Admitting: Family Medicine

## 2015-06-13 VITALS — BP 132/84 | HR 66 | Temp 97.8°F | Resp 15 | Ht 67.0 in | Wt 178.2 lb

## 2015-06-13 DIAGNOSIS — R05 Cough: Secondary | ICD-10-CM | POA: Diagnosis not present

## 2015-06-13 DIAGNOSIS — J019 Acute sinusitis, unspecified: Secondary | ICD-10-CM

## 2015-06-13 DIAGNOSIS — J209 Acute bronchitis, unspecified: Secondary | ICD-10-CM | POA: Diagnosis not present

## 2015-06-13 DIAGNOSIS — R059 Cough, unspecified: Secondary | ICD-10-CM

## 2015-06-13 MED ORDER — BENZONATATE 100 MG PO CAPS: 100.0000 mg | ORAL_CAPSULE | Freq: Three times a day (TID) | ORAL | Status: DC | PRN

## 2015-06-13 MED ORDER — AMOXICILLIN-POT CLAVULANATE 875-125 MG PO TABS
1.0000 | ORAL_TABLET | Freq: Two times a day (BID) | ORAL | Status: DC
Start: 2015-06-13 — End: 2016-04-07

## 2015-06-13 MED ORDER — HYDROCODONE-HOMATROPINE 5-1.5 MG/5ML PO SYRP: 5.0000 mL | ORAL_SOLUTION | ORAL | Status: DC | PRN

## 2015-06-13 NOTE — Progress Notes (Signed)
Patient ID: LISANNE COBB, female    DOB: 06/17/1956  Age: 59 y.o. MRN: [id number]  Chief Complaint  Patient presents with  . Cough  . Sore Throat    x 2 week ago  . Nasal Congestion    Subjective:   59 year old lady who has a two-week history of a respiratory tract infection. It began with a cough and then went up into her hand. She has gotten worse rather than better. She's not been running fevers. She's had a persistent cough that bothers her day and night. Her head feels congested. Smells are bothering her. Her throat is been sore. She has been continuing to work. She has tried numerous OTC medications and her Flonase also prescription. She says things seem to dry her nose too much. Mucinex made her too loose. She has had some nausea but she thinks that's from the postnasal drainage going down. No one else at home is significantly ill.  Current allergies, medications, problem list, past/family and social histories reviewed.  Objective:  BP 132/84 mmHg  Pulse 66  Temp(Src) 97.8 F (36.6 C) (Oral)  Resp 15  Ht 5\' 7"  (1.702 m)  Wt 178 lb 3.2 oz (80.831 kg)  BMI 27.90 kg/m2  SpO2 98%  Pleasant lady, alert. Doesn't feel good. Her TMs are normal. Throat not erythematous. Neck supple without significant nodes. Chest is clear to auscultation. Heart regular without murmurs. No major chest wall tenderness. Abdomen soft without mass or tenderness. There is over maxillary and frontal sinus areas.  Assessment & Plan:   Assessment: 1. Acute bronchitis, unspecified organism   2. Acute rhinosinusitis       Plan: Treat clinically. If she gets worse would need to do other testing on her.  No orders of the defined types were placed in this encounter.    Meds ordered this encounter  Medications  . amoxicillin-clavulanate (AUGMENTIN) 875-125 MG tablet    Sig: Take 1 tablet by mouth 2 (two) times daily.    Dispense:  20 tablet    Refill:  0  . HYDROcodone-homatropine (HYCODAN) 5-1.5  MG/5ML syrup    Sig: Take 5 mLs by mouth every 4 (four) hours as needed.    Dispense:  120 mL    Refill:  0         Patient Instructions  Drink plenty of fluids and get enough rest  It is reasonable to continue trying to use some of the Mucinex to help keep secretions thin.  If he knows can tolerate it, the Flonase would or should help open up the sinuses, and I would use it twice daily for a few days  Take Augmentin (amoxicillin/clavulanate) 875 mg one twice daily with food  Take the Hycodan cough syrup 1 teaspoon every 4-6 hours as needed for cough (Hycodan does cause some sedation)  Take benzonatate 1 or 2 pills 3 times daily if needed for daytime cough when trying to work     Return if symptoms worsen or fail to improve.   Colie Fugitt, MD 06/13/2015

## 2015-06-13 NOTE — Patient Instructions (Signed)
Drink plenty of fluids and get enough rest  It is reasonable to continue trying to use some of the Mucinex to help keep secretions thin.  If he knows can tolerate it, the Flonase would or should help open up the sinuses, and I would use it twice daily for a few days  Take Augmentin (amoxicillin/clavulanate) 875 mg one twice daily with food  Take the Hycodan cough syrup 1 teaspoon every 4-6 hours as needed for cough (Hycodan does cause some sedation)  Take benzonatate 1 or 2 pills 3 times daily if needed for daytime cough when trying to work

## 2016-04-07 ENCOUNTER — Ambulatory Visit (INDEPENDENT_AMBULATORY_CARE_PROVIDER_SITE_OTHER): Payer: BLUE CROSS/BLUE SHIELD | Admitting: Physician Assistant

## 2016-04-07 VITALS — BP 120/84 | HR 60 | Temp 98.1°F | Resp 17 | Ht 67.0 in | Wt 170.0 lb

## 2016-04-07 DIAGNOSIS — Z13220 Encounter for screening for lipoid disorders: Secondary | ICD-10-CM

## 2016-04-07 DIAGNOSIS — Z130 Encounter for screening for diseases of the blood and blood-forming organs and certain disorders involving the immune mechanism: Secondary | ICD-10-CM | POA: Diagnosis not present

## 2016-04-07 DIAGNOSIS — Z0000 Encounter for general adult medical examination without abnormal findings: Secondary | ICD-10-CM

## 2016-04-07 DIAGNOSIS — Z1159 Encounter for screening for other viral diseases: Secondary | ICD-10-CM | POA: Diagnosis not present

## 2016-04-07 DIAGNOSIS — Z13228 Encounter for screening for other metabolic disorders: Secondary | ICD-10-CM

## 2016-04-07 DIAGNOSIS — F419 Anxiety disorder, unspecified: Secondary | ICD-10-CM | POA: Diagnosis not present

## 2016-04-07 DIAGNOSIS — Z1329 Encounter for screening for other suspected endocrine disorder: Secondary | ICD-10-CM

## 2016-04-07 DIAGNOSIS — Z114 Encounter for screening for human immunodeficiency virus [HIV]: Secondary | ICD-10-CM | POA: Diagnosis not present

## 2016-04-07 DIAGNOSIS — K219 Gastro-esophageal reflux disease without esophagitis: Secondary | ICD-10-CM

## 2016-04-07 DIAGNOSIS — J309 Allergic rhinitis, unspecified: Secondary | ICD-10-CM | POA: Insufficient documentation

## 2016-04-07 LAB — CBC WITH DIFFERENTIAL/PLATELET
BASOS PCT: 1 %
Basophils Absolute: 47 cells/uL (ref 0–200)
Eosinophils Absolute: 141 cells/uL (ref 15–500)
Eosinophils Relative: 3 %
HEMATOCRIT: 41.3 % (ref 35.0–45.0)
HEMOGLOBIN: 14.1 g/dL (ref 11.7–15.5)
LYMPHS ABS: 1880 {cells}/uL (ref 850–3900)
Lymphocytes Relative: 40 %
MCH: 31.2 pg (ref 27.0–33.0)
MCHC: 34.1 g/dL (ref 32.0–36.0)
MCV: 91.4 fL (ref 80.0–100.0)
MONO ABS: 329 {cells}/uL (ref 200–950)
MPV: 9.7 fL (ref 7.5–12.5)
Monocytes Relative: 7 %
Neutro Abs: 2303 cells/uL (ref 1500–7800)
Neutrophils Relative %: 49 %
Platelets: 225 10*3/uL (ref 140–400)
RBC: 4.52 MIL/uL (ref 3.80–5.10)
RDW: 13.2 % (ref 11.0–15.0)
WBC: 4.7 10*3/uL (ref 3.8–10.8)

## 2016-04-07 LAB — COMPLETE METABOLIC PANEL WITH GFR
ALT: 16 U/L (ref 6–29)
AST: 18 U/L (ref 10–35)
Albumin: 4.2 g/dL (ref 3.6–5.1)
Alkaline Phosphatase: 53 U/L (ref 33–130)
BUN: 16 mg/dL (ref 7–25)
CALCIUM: 8.8 mg/dL (ref 8.6–10.4)
CHLORIDE: 105 mmol/L (ref 98–110)
CO2: 23 mmol/L (ref 20–31)
Creat: 0.83 mg/dL (ref 0.50–1.05)
GFR, EST AFRICAN AMERICAN: 89 mL/min (ref >=60)
GFR, Est Non African American: 77 mL/min (ref >=60)
Glucose, Bld: 100 mg/dL — ABNORMAL HIGH (ref 65–99)
POTASSIUM: 4.1 mmol/L (ref 3.5–5.3)
Sodium: 137 mmol/L (ref 135–146)
Total Bilirubin: 0.5 mg/dL (ref 0.2–1.2)
Total Protein: 6.6 g/dL (ref 6.1–8.1)

## 2016-04-07 LAB — LIPID PANEL
CHOL/HDL RATIO: 3.9 ratio (ref <=5.0)
CHOLESTEROL: 208 mg/dL — AB (ref 125–200)
HDL: 53 mg/dL (ref >=46)
LDL CALC: 139 mg/dL — AB (ref <130)
TRIGLYCERIDES: 82 mg/dL (ref <150)
VLDL: 16 mg/dL (ref <30)

## 2016-04-07 LAB — HEPATITIS C ANTIBODY: HCV AB: NEGATIVE

## 2016-04-07 LAB — HIV ANTIBODY (ROUTINE TESTING W REFLEX): HIV: NONREACTIVE

## 2016-04-07 MED ORDER — PAROXETINE HCL 10 MG PO TABS: 10.0000 mg | ORAL_TABLET | Freq: Every day | ORAL | 5 refills | Status: DC

## 2016-04-07 MED ORDER — OMEPRAZOLE 40 MG PO CPDR: DELAYED_RELEASE_CAPSULE | ORAL | 1 refills | Status: DC

## 2016-04-07 MED ORDER — ALPRAZOLAM 0.5 MG PO TABS: 0.2500 mg | ORAL_TABLET | Freq: Every evening | ORAL | 0 refills | Status: DC | PRN

## 2016-04-07 NOTE — Patient Instructions (Addendum)
Try to walk at least 20 minutes every day! Thank you for letting me participate in your health and well being.  Heart-Healthy Eating Plan Heart-healthy meal planning includes:  Limiting unhealthy fats.  Increasing healthy fats.  Making other small dietary changes. You may need to talk with your doctor or a diet specialist (dietitian) to create an eating plan that is right for you. WHAT TYPES OF FAT SHOULD I CHOOSE?  Choose healthy fats. These include olive oil and canola oil, flaxseeds, walnuts, almonds, and seeds.  Eat more omega-3 fats. These include salmon, mackerel, sardines, tuna, flaxseed oil, and ground flaxseeds. Try to eat fish at least twice each week.  Limit saturated fats.  Saturated fats are often found in animal products, such as meats, butter, and cream.  Plant sources of saturated fats include palm oil, palm kernel oil, and coconut oil.  Avoid foods with partially hydrogenated oils in them. These include stick margarine, some tub margarines, cookies, crackers, and other baked goods. These contain trans fats. WHAT GENERAL GUIDELINES DO I NEED TO FOLLOW?  Check food labels carefully. Identify foods with trans fats or high amounts of saturated fat.  Fill one half of your plate with vegetables and green salads. Eat 4-5 servings of vegetables per day. A serving of vegetables is:  1 cup of raw leafy vegetables.   cup of raw or cooked cut-up vegetables.   cup of vegetable juice.  Fill one fourth of your plate with whole grains. Look for the word "whole" as the first word in the ingredient list.  Fill one fourth of your plate with lean protein foods.  Eat 4-5 servings of fruit per day. A serving of fruit is:  One medium whole fruit.   cup of dried fruit.   cup of fresh, frozen, or canned fruit.   cup of 100% fruit juice.  Eat more foods that contain soluble fiber. These include apples, broccoli, carrots, beans, peas, and barley. Try to get 20-30 g of  fiber per day.  Eat more home-cooked food. Eat less restaurant, buffet, and fast food.  Limit or avoid alcohol.  Limit foods high in starch and sugar.  Avoid fried foods.  Avoid frying your food. Try baking, boiling, grilling, or broiling it instead. You can also reduce fat by:  Removing the skin from poultry.  Removing all visible fats from meats.  Skimming the fat off of stews, soups, and gravies before serving them.  Steaming vegetables in water or broth.  Lose weight if you are overweight.  Eat 4-5 servings of nuts, legumes, and seeds per week:  One serving of dried beans or legumes equals  cup after being cooked.  One serving of nuts equals 1 ounces.  One serving of seeds equals  ounce or one tablespoon.  You may need to keep track of how much salt or sodium you eat. This is especially true if you have high blood pressure. Talk with your doctor or dietitian to get more information. WHAT FOODS CAN I EAT? Grains Breads, including [place], white, pita, wheat, raisin, rye, oatmeal, and [place]. Tortillas that are neither fried nor made with lard or trans fat. Low-fat rolls, including hotdog and hamburger buns and English muffins. Biscuits. Muffins. Waffles. Pancakes. Light popcorn. Whole-grain cereals. Flatbread. Melba toast. Pretzels. Breadsticks. Rusks. Low-fat snacks. Low-fat crackers, including oyster, saltine, matzo, graham, animal, and rye. Rice and pasta, including brown rice and pastas that are made with whole wheat.  Vegetables All vegetables.  Fruits All fruits, but  limit coconut. Meats and Other Protein Sources Lean, well-trimmed beef, veal, pork, and lamb. Chicken and [place] without skin. All fish and shellfish. Wild duck, rabbit, pheasant, and venison. Egg whites or low-cholesterol egg substitutes. Dried beans, peas, lentils, and tofu. Seeds and most nuts. Dairy Low-fat or nonfat cheeses, including ricotta, string, and mozzarella. Skim or 1% milk that is  liquid, powdered, or evaporated. Buttermilk that is made with low-fat milk. Nonfat or low-fat yogurt. Beverages Mineral water. Diet carbonated beverages. Sweets and Desserts Sherbets and fruit ices. Honey, jam, marmalade, jelly, and syrups. Meringues and gelatins. Pure sugar candy, such as hard candy, jelly beans, gumdrops, mints, marshmallows, and small amounts of dark chocolate. [employer] Eat all sweets and desserts in moderation. Fats and Oils Nonhydrogenated (trans-free) margarines. Vegetable oils, including soybean, sesame, sunflower, olive, peanut, safflower, corn, canola, and cottonseed. Salad dressings or mayonnaise made with a vegetable oil. Limit added fats and oils that you use for cooking, baking, salads, and as spreads. Other Cocoa powder. Coffee and tea. All seasonings and condiments. The items listed above may not be a complete list of recommended foods or beverages. Contact your dietitian for more options. WHAT FOODS ARE NOT RECOMMENDED? Grains Breads that are made with saturated or trans fats, oils, or whole milk. Croissants. Butter rolls. Cheese breads. Sweet rolls. Donuts. Buttered popcorn. Chow mein noodles. High-fat crackers, such as cheese or butter crackers. Meats and Other Protein Sources Fatty meats, such as hotdogs, short ribs, sausage, spareribs, bacon, rib eye roast or steak, and mutton. High-fat deli meats, such as salami and bologna. Caviar. Domestic duck and goose. Organ meats, such as kidney, liver, sweetbreads, and heart. Dairy Cream, sour cream, cream cheese, and creamed cottage cheese. Whole-milk cheeses, including blue (bleu), Monterey Jack, [place], [place], American, [place], Swiss, cheddar, [place], and [place]. Whole or 2% milk that is liquid, evaporated, or condensed. Whole buttermilk. Cream sauce or high-fat cheese sauce. Yogurt that is made from whole milk. Beverages Regular sodas and juice drinks with added sugar. Sweets and Desserts Frosting.  Pudding. Cookies. Cakes other than angel food cake. Candy that has milk chocolate or white chocolate, hydrogenated fat, butter, coconut, or unknown ingredients. Buttered syrups. Full-fat ice cream or ice cream drinks. Fats and Oils Gravy that has suet, meat fat, or shortening. Cocoa butter, hydrogenated oils, palm oil, coconut oil, palm kernel oil. These can often be found in baked products, candy, fried foods, nondairy creamers, and whipped toppings. Solid fats and shortenings, including bacon fat, salt pork, lard, and butter. Nondairy cream substitutes, such as coffee creamers and sour cream substitutes. Salad dressings that are made of unknown oils, cheese, or sour cream. The items listed above may not be a complete list of foods and beverages to avoid. Contact your dietitian for more information.   This information is not intended to replace advice given to you by your health care provider. Make sure you discuss any questions you have with your health care provider.   Document Released: 01/03/2012 Document Revised: 07/25/2014 Document Reviewed: 12/26/2013 Elsevier Interactive Patient Education 2016 [place] for Gastroesophageal Reflux Disease, Adult When you have gastroesophageal reflux disease (GERD), the foods you eat and your eating habits are very important. Choosing the right foods can help ease the discomfort of GERD. WHAT GENERAL GUIDELINES DO I NEED TO FOLLOW?  Choose fruits, vegetables, whole grains, low-fat dairy products, and low-fat meat, fish, and poultry.  Limit fats such as oils, salad dressings, butter, nuts, and avocado.  Keep  a food diary to identify foods that cause symptoms.  Avoid foods that cause reflux. These may be different for different people.  Eat frequent small meals instead of three large meals each day.  Eat your meals slowly, in a relaxed setting.  Limit fried foods.  Cook foods using methods other than frying.  Avoid drinking  alcohol.  Avoid drinking large amounts of liquids with your meals.  Avoid bending over or lying down until 2-3 hours after eating. WHAT FOODS ARE NOT RECOMMENDED? The following are some foods and drinks that may worsen your symptoms: Vegetables Tomatoes. Tomato juice. Tomato and spaghetti sauce. Chili peppers. Onion and garlic. Horseradish. Fruits Oranges, grapefruit, and lemon (fruit and juice). Meats High-fat meats, fish, and poultry. This includes hot dogs, ribs, ham, sausage, salami, and bacon. Dairy Whole milk and chocolate milk. Sour cream. Cream. Butter. Ice cream. Cream cheese.  Beverages Coffee and tea, with or without caffeine. Carbonated beverages or energy drinks. Condiments Hot sauce. Barbecue sauce.  Sweets/Desserts Chocolate and cocoa. Donuts. Peppermint and spearmint. Fats and Oils High-fat foods, including [place] fries and potato chips. Other Vinegar. Strong spices, such as black pepper, white pepper, red pepper, cayenne, curry powder, cloves, ginger, and chili powder. The items listed above may not be a complete list of foods and beverages to avoid. Contact your dietitian for more information.   This information is not intended to replace advice given to you by your health care provider. Make sure you discuss any questions you have with your health care provider.   Document Released: 07/04/2005 Document Revised: 07/25/2014 Document Reviewed: 05/08/2013 Elsevier Interactive Patient Education 2016 [employer].   IF you received an x-ray today, you will receive an invoice from [facility]. Please contact [facility] at [phone] with questions or concerns regarding your invoice.   IF you received labwork today, you will receive an invoice from [employer]. Please contact [facility] at [phone] with questions or concerns regarding your invoice.   Our billing staff will not be able to assist you with questions  regarding bills from these companies.  You will be contacted with the lab results as soon as they are available. The fastest way to get your results is to activate your My Chart account. Instructions are located on the last page of this paperwork. If you have not heard from [place] regarding the results in 2 weeks, please contact this office.

## 2016-04-07 NOTE — Progress Notes (Signed)
Tracy Hardy  MRN: [id number] DOB: April 02, 1956  Subjective:  Patient is a 60 y.o. female who presents for annual physical exam and medication refill for anxiety and GERD medications.   Anxiety: Pt has had a history of anxiety for over 20 years.  She has been on xanax for 20 years and paxil for 3 years. She has tried fluoxetine in the past but found it was not effective for her. She is currently taking paxil 10mg  and xanax 0.25-0.5 at night.  She has tired decreaseing xanax to every other day but on the nights she did not take it she was very shaky and had increased heart rate. She would like to have a referral to psych because her doctor that follows her for this is retiring and she is wondering if there are other things she can try for her anxiety so she does not have to be on two mediciations.   Exercise: She does not do structured exercise but does do all of the household chores.  Diet: Egg/sausage in morning, light lunch, salad/meat/veggie for dinner. More chicken, [place], and fish. Drinks mostly water and drinks milk with dinner.  Sleep: Gets about 8 hours of sleep a night. Fells very rested.  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 Pt has good relationshop with husband. Sexually active, in a monogamous relationship. Has two kids. One is Armed forces logistics/support/administrative officer and one is in [employer] in [place].   GERD: Controlled on 20mg  of prilosec daily, some days she needs to take two.   Last dental exam: 02/2016 Last vision exam: 05/2015 Last pap smear: 08/2015 Last mammogram: 08/2015 Last colonoscopy: 2014 Vaccinations      Tetanus: 03/30/2012   Patient Active Problem List   Diagnosis Date Noted  . Allergic rhinitis 04/07/2016  . Anxiety 04/07/2016  . GERD (gastroesophageal reflux disease) 04/07/2016    No current outpatient prescriptions on file prior to visit.   No current facility-administered medications on file prior to visit.     Allergies  Allergen Reactions  .  Codeine     Social History   Social History  . Marital status: Single    Spouse name: N/A  . Number of children: N/A  . Years of education: N/A   Social History Main Topics  . Smoking status: Former Smoker    Quit date: 04/07/1993  . Smokeless tobacco: None  . Alcohol use 0.6 oz/week    1 Cans of beer per week  . Drug use: No  . Sexual activity: Yes    Birth control/ protection: Surgical     Comment: married, monogamous   Other Topics Concern  . None   Social History Narrative  . None    Past Surgical History:  Procedure Laterality Date  . CESAREAN SECTION    . TUBAL LIGATION      Family History  Problem Relation Age of Onset  . Hypertension Mother     Review of Systems Objective:  BP 120/84 (BP Location: Left Arm, Patient Position: Sitting, Cuff Size: Large)   Pulse 60   Temp 98.1 F (36.7 C) (Oral)   Resp 17   Ht 5\' 7"  (1.702 m)   Wt 170 lb (77.1 kg)   SpO2 97%   BMI 26.63 kg/m   Physical Exam  Constitutional: She is oriented to person, place, and time and well-developed, well-nourished, and in no distress.  HENT:  Head: Normocephalic and atraumatic.  Right Ear: Hearing, tympanic membrane, external ear and ear canal normal.  Left Ear: Hearing, tympanic membrane, external ear and ear canal normal.  Nose: Nose normal.  Mouth/Throat: Uvula is midline, oropharynx is clear and moist and mucous membranes are normal. No oropharyngeal exudate.  Eyes: Conjunctivae, EOM and lids are normal. Pupils are equal, round, and reactive to light. No scleral icterus.  Neck: Trachea normal and normal range of motion. No thyroid mass and no thyromegaly present.  Cardiovascular: Normal rate, regular rhythm, normal heart sounds and intact distal pulses.   Pulmonary/Chest: Effort normal and breath sounds normal.  Abdominal: Soft. Normal appearance and bowel sounds are normal. There is no tenderness.  Lymphadenopathy:       Head (right side): No tonsillar, no preauricular,  no posterior auricular and no occipital adenopathy present.       Head (left side): No tonsillar, no preauricular, no posterior auricular and no occipital adenopathy present.    She has no cervical adenopathy.       Right: No supraclavicular adenopathy present.       Left: No supraclavicular adenopathy present.  Neurological: She is alert and oriented to person, place, and time. She has normal sensation, normal strength and normal reflexes. Gait normal.  Skin: Skin is warm and dry.  Psychiatric: Affect normal.    Visual Acuity Screening   Right eye Left eye Both eyes  Without correction:     With correction: 20 70 20 25 20 20     Assessment and Plan :  Discussed healthy lifestyle, diet, exercise, preventative care, vaccinations, and addressed patient's concerns. Plan for follow up in one year. Otherwise, plan for specific conditions below.  1. Annual physical exam  2. Anxiety -Discussed with pt that I would be willing to give up to two more refills for this (for a total of three prescriptions) but will not be willing to continue this medication for anxiety in the future unless she is willing to consider increasing Paxil and decreasing the frequency of using xanax.  - ALPRAZolam (XANAX) 0.5 MG tablet; Take 0.5-1 tablets (0.25-0.5 mg total) by mouth at bedtime as needed.  Dispense: 30 tablet; Refill: 0 - PARoxetine (PAXIL) 10 MG tablet; Take 1 tablet (10 mg total) by mouth daily.  Dispense: 30 tablet; Refill: 5 - Ambulatory referral to Psychiatry  3. Gastroesophageal reflux disease, esophagitis presence not specified - omeprazole (PRILOSEC) 40 MG capsule; Take one tablet as needed.  Dispense: 30 capsule; Refill: 1   4. Screening for lipid disorders - Lipid panel  5. Screening for metabolic disorder - COMPLETE METABOLIC PANEL WITH GFR  6. Screening for deficiency anemia - CBC with Differential/Platelet  7. Screening for thyroid disorder - TSH  8. Screening for HIV (human  immunodeficiency virus) - HIV antibody  9. Need for hepatitis C screening test - Hepatitis C antibody       Tenna Delaine PA-C  [facility] 04/07/2016 12:26 PM

## 2016-04-08 LAB — TSH: TSH: 1.71 mIU/L

## 2016-05-09 ENCOUNTER — Other Ambulatory Visit: Payer: Self-pay | Admitting: Physician Assistant

## 2016-05-09 DIAGNOSIS — K219 Gastro-esophageal reflux disease without esophagitis: Secondary | ICD-10-CM

## 2016-05-16 ENCOUNTER — Other Ambulatory Visit: Payer: Self-pay | Admitting: Physician Assistant

## 2016-05-16 DIAGNOSIS — F419 Anxiety disorder, unspecified: Secondary | ICD-10-CM

## 2016-05-17 NOTE — Telephone Encounter (Signed)
9/21 pt was last seen and given 30 day Rx. Pended.

## 2016-05-18 NOTE — Telephone Encounter (Signed)
Please remind the patient that at the most recent visit I told the patient below:   2. Anxiety -Discussed with pt that I would be willing to give up to two more refills for this (for a total of three prescriptions) but will not be willing to continue this medication for anxiety in the future unless she is willing to consider increasing Paxil and decreasing the frequency of using xanax.  - ALPRAZolam (XANAX) 0.5 MG tablet; Take 0.5-1 tablets (0.25-0.5 mg total) by mouth at bedtime as needed.  Dispense: 30 tablet; Refill: 0 - PARoxetine (PAXIL) 10 MG tablet; Take 1 tablet (10 mg total) by mouth daily.  Dispense: 30 tablet; Refill: 5 - Ambulatory referral to Psychiatry  Therefore, I will refill it this time and an additional one more time but will not be willing to prescribe it for her anxiety after this. I have also given her a referral to psychiatry so please make sure she has followed up with this. Thank you!

## 2016-05-19 NOTE — Telephone Encounter (Signed)
Called in RF. LMOM for pt advising of Brittany's message below.

## 2016-07-06 ENCOUNTER — Other Ambulatory Visit: Payer: Self-pay | Admitting: Physician Assistant

## 2016-07-06 DIAGNOSIS — F419 Anxiety disorder, unspecified: Secondary | ICD-10-CM

## 2016-07-08 NOTE — Telephone Encounter (Signed)
LM to call about her follow up Plan for her Xanax refill.  Per [facility] note

## 2016-09-11 DIAGNOSIS — J018 Other acute sinusitis: Secondary | ICD-10-CM | POA: Diagnosis not present

## 2016-09-11 DIAGNOSIS — J209 Acute bronchitis, unspecified: Secondary | ICD-10-CM | POA: Diagnosis not present

## 2016-09-12 ENCOUNTER — Ambulatory Visit (INDEPENDENT_AMBULATORY_CARE_PROVIDER_SITE_OTHER): Payer: 59 | Admitting: Urgent Care

## 2016-09-12 VITALS — BP 136/90 | HR 75 | Temp 98.3°F | Resp 18 | Ht 67.0 in | Wt 169.0 lb

## 2016-09-12 DIAGNOSIS — R0981 Nasal congestion: Secondary | ICD-10-CM

## 2016-09-12 DIAGNOSIS — R52 Pain, unspecified: Secondary | ICD-10-CM | POA: Diagnosis not present

## 2016-09-12 DIAGNOSIS — R059 Cough, unspecified: Secondary | ICD-10-CM

## 2016-09-12 DIAGNOSIS — R05 Cough: Secondary | ICD-10-CM

## 2016-09-12 DIAGNOSIS — R5383 Other fatigue: Secondary | ICD-10-CM | POA: Diagnosis not present

## 2016-09-12 MED ORDER — BENZONATATE 100 MG PO CAPS: 100.0000 mg | ORAL_CAPSULE | Freq: Three times a day (TID) | ORAL | 0 refills | Status: DC | PRN

## 2016-09-12 MED ORDER — CETIRIZINE HCL 10 MG PO TABS: 10.0000 mg | ORAL_TABLET | Freq: Every day | ORAL | 11 refills | Status: DC

## 2016-09-12 MED ORDER — AZITHROMYCIN 250 MG PO TABS: ORAL_TABLET | ORAL | 0 refills | Status: DC

## 2016-09-12 MED ORDER — PSEUDOEPHEDRINE HCL 60 MG PO TABS: 60.0000 mg | ORAL_TABLET | Freq: Three times a day (TID) | ORAL | 0 refills | Status: DC | PRN

## 2016-09-12 MED ORDER — HYDROCOD POLST-CPM POLST ER 10-8 MG/5ML PO SUER: 5.0000 mL | Freq: Every evening | ORAL | 0 refills | Status: DC | PRN

## 2016-09-12 NOTE — Progress Notes (Signed)
  MRN: [id number] DOB: 01/17/1956  Subjective:   Tracy Hardy is a 61 y.o. female presenting for chief complaint of Cough (since wednesday ) and Nasal Congestion  Reports 5 day history of productive cough, sinus congestion, headaches, chest congestion, bilateral ear pain, fatigue, body aches. Cough elicits wheezing, interrupts sleep. Has tried Mucinex, Nasacort, Advil. Denies fever, chest pain, shob, ear drainage, sore throat, n/v, abdominal pain, rashes. Denies smoking cigarettes. Denies history of asthma. Patient works with dialysis patients. Has not been able to work as a result.  Tracy Hardy has a current medication list which includes the following prescription(s): alprazolam, fluoxetine, omeprazole, and paroxetine. Also is allergic to codeine.  Tracy Hardy  has a past medical history of Allergy and GERD (gastroesophageal reflux disease). Also  has a past surgical history that includes Cesarean section and Tubal ligation.  Objective:   Vitals: BP 136/90 (BP Location: Right Arm, Patient Position: Sitting, Cuff Size: Small)   Pulse 75   Temp 98.3 F (36.8 C) (Oral)   Resp 18   Ht 5\' 7"  (1.702 m)   Wt 169 lb (76.7 kg)   SpO2 98%   BMI 26.47 kg/m   Physical Exam  Constitutional: She is oriented to person, place, and time. She appears well-developed and well-nourished.  HENT:  TM's intact bilaterally, no effusions or erythema. Nasal turbinates pink and moist, nasal passages patent. Bilateral maxillary sinus tenderness, R>L. Oropharynx with moderate post-nasal drainage, mucous membranes moist, dentition in good repair.  Eyes: Right eye exhibits no discharge. Left eye exhibits no discharge. No scleral icterus.  Neck: Normal range of motion. Neck supple.  Cardiovascular: Normal rate, regular rhythm and intact distal pulses.  Exam reveals no gallop and no friction rub.   No murmur heard. Pulmonary/Chest: No respiratory distress. She has wheezes. She has rales (right mid-lower lung fields).    Lymphadenopathy:    She has cervical adenopathy (R>L).  Neurological: She is alert and oriented to person, place, and time.  Skin: Skin is warm and dry.   Assessment and Plan :   1. Cough 2. Sinus congestion 3. Body aches 4. Other fatigue - Will cover for lower respiratory infection with azithromycin. Cough suppression offered. Patient is to return to clinic if no improvement, consider chest x-ray at that point.  Jaynee Eagles, PA-C [facility] [phone] 09/12/2016  1:50 PM

## 2016-09-12 NOTE — Patient Instructions (Addendum)
Cough, Adult Coughing is a reflex that clears your throat and your airways. Coughing helps to heal and protect your lungs. It is normal to cough occasionally, but a cough that happens with other symptoms or lasts a long time may be a sign of a condition that needs treatment. A cough may last only 2-3 weeks (acute), or it may last longer than 8 weeks (chronic). What are the causes? Coughing is commonly caused by:  Breathing in substances that irritate your lungs.  A viral or bacterial respiratory infection.  Allergies.  Asthma.  Postnasal drip.  Smoking.  Acid backing up from the stomach into the esophagus (gastroesophageal reflux).  Certain medicines.  Chronic lung problems, including COPD (or rarely, lung cancer).  Other medical conditions such as heart failure. Follow these instructions at home: Pay attention to any changes in your symptoms. Take these actions to help with your discomfort:  Take medicines only as told by your health care provider.  If you were prescribed an antibiotic medicine, take it as told by your health care provider. Do not stop taking the antibiotic even if you start to feel better.  Talk with your health care provider before you take a cough suppressant medicine.  Drink enough fluid to keep your urine clear or pale yellow.  If the air is dry, use a cold steam vaporizer or humidifier in your bedroom or your home to help loosen secretions.  Avoid anything that causes you to cough at work or at home.  If your cough is worse at night, try sleeping in a semi-upright position.  Avoid cigarette smoke. If you smoke, quit smoking. If you need help quitting, ask your health care provider.  Avoid caffeine.  Avoid alcohol.  Rest as needed. Contact a health care provider if:  You have new symptoms.  You cough up pus.  Your cough does not get better after 2-3 weeks, or your cough gets worse.  You cannot control your cough with suppressant medicines  and you are losing sleep.  You develop pain that is getting worse or pain that is not controlled with pain medicines.  You have a fever.  You have unexplained weight loss.  You have night sweats. Get help right away if:  You cough up blood.  You have difficulty breathing.  Your heartbeat is very fast. This information is not intended to replace advice given to you by your health care provider. Make sure you discuss any questions you have with your health care provider. Document Released: 12/31/2010 Document Revised: 12/10/2015 Document Reviewed: 09/10/2014 Elsevier Interactive Patient Education  2017 [employer] Inc[employer]     IF you received an x-ray today, you will receive an invoice from [facility]. Please contact [facility] at [phone] with questions or concerns regarding your invoice.   IF you received labwork today, you will receive an invoice from [facility]. Please contact [facility] at [phone] with questions or concerns regarding your invoice.   Our billing staff will not be able to assist you with questions regarding bills from these companies.  You will be contacted with the lab results as soon as they are available. The fastest way to get your results is to activate your My Chart account. Instructions are located on the last page of this paperwork. If you have not heard from us regarding the results in 2 weeks, please contact this office.      

## 2016-09-23 ENCOUNTER — Ambulatory Visit (INDEPENDENT_AMBULATORY_CARE_PROVIDER_SITE_OTHER): Payer: 59

## 2016-09-23 ENCOUNTER — Ambulatory Visit (INDEPENDENT_AMBULATORY_CARE_PROVIDER_SITE_OTHER): Payer: 59 | Admitting: Urgent Care

## 2016-09-23 VITALS — BP 138/90 | HR 80 | Temp 97.6°F | Resp 18 | Ht 67.0 in | Wt 169.8 lb

## 2016-09-23 DIAGNOSIS — J029 Acute pharyngitis, unspecified: Secondary | ICD-10-CM

## 2016-09-23 DIAGNOSIS — R05 Cough: Secondary | ICD-10-CM | POA: Diagnosis not present

## 2016-09-23 DIAGNOSIS — R059 Cough, unspecified: Secondary | ICD-10-CM

## 2016-09-23 DIAGNOSIS — R0789 Other chest pain: Secondary | ICD-10-CM

## 2016-09-23 LAB — POCT CBC
GRANULOCYTE PERCENT: 58.5 % (ref 37–80)
HEMATOCRIT: 39.7 % (ref 37.7–47.9)
HEMOGLOBIN: 14 g/dL (ref 12.2–16.2)
Lymph, poc: 2.1 (ref 0.6–3.4)
MCH, POC: 32 pg — AB (ref 27–31.2)
MCHC: 35.2 g/dL (ref 31.8–35.4)
MCV: 90.8 fL (ref 80–97)
MID (cbc): 0.5 (ref 0–0.9)
MPV: 6.6 fL (ref 0–99.8)
POC GRANULOCYTE: 3.7 (ref 2–6.9)
POC LYMPH PERCENT: 33.3 %L (ref 10–50)
POC MID %: 8.2 %M (ref 0–12)
Platelet Count, POC: 245 10*3/uL (ref 142–424)
RBC: 4.37 M/uL (ref 4.04–5.48)
RDW, POC: 12.4 %
WBC: 6.3 10*3/uL (ref 4.6–10.2)

## 2016-09-23 MED ORDER — ALBUTEROL SULFATE HFA 108 (90 BASE) MCG/ACT IN AERS: 2.0000 | INHALATION_SPRAY | Freq: Four times a day (QID) | RESPIRATORY_TRACT | 1 refills | Status: DC | PRN

## 2016-09-23 MED ORDER — METHYLPREDNISOLONE ACETATE 80 MG/ML IJ SUSP
80.0000 mg | Freq: Once | INTRAMUSCULAR | Status: AC
  Administered 2016-09-23: 80 mg via INTRAMUSCULAR

## 2016-09-23 NOTE — Patient Instructions (Addendum)
Cough, Adult Coughing is a reflex that clears your throat and your airways. Coughing helps to heal and protect your lungs. It is normal to cough occasionally, but a cough that happens with other symptoms or lasts a long time may be a sign of a condition that needs treatment. A cough may last only 2-3 weeks (acute), or it may last longer than 8 weeks (chronic). What are the causes? Coughing is commonly caused by:  Breathing in substances that irritate your lungs.  A viral or bacterial respiratory infection.  Allergies.  Asthma.  Postnasal drip.  Smoking.  Acid backing up from the stomach into the esophagus (gastroesophageal reflux).  Certain medicines.  Chronic lung problems, including COPD (or rarely, lung cancer).  Other medical conditions such as heart failure. Follow these instructions at home: Pay attention to any changes in your symptoms. Take these actions to help with your discomfort:  Take medicines only as told by your health care provider.  If you were prescribed an antibiotic medicine, take it as told by your health care provider. Do not stop taking the antibiotic even if you start to feel better.  Talk with your health care provider before you take a cough suppressant medicine.  Drink enough fluid to keep your urine clear or pale yellow.  If the air is dry, use a cold steam vaporizer or humidifier in your bedroom or your home to help loosen secretions.  Avoid anything that causes you to cough at work or at home.  If your cough is worse at night, try sleeping in a semi-upright position.  Avoid cigarette smoke. If you smoke, quit smoking. If you need help quitting, ask your health care provider.  Avoid caffeine.  Avoid alcohol.  Rest as needed. Contact a health care provider if:  You have new symptoms.  You cough up pus.  Your cough does not get better after 2-3 weeks, or your cough gets worse.  You cannot control your cough with suppressant medicines  and you are losing sleep.  You develop pain that is getting worse or pain that is not controlled with pain medicines.  You have a fever.  You have unexplained weight loss.  You have night sweats. Get help right away if:  You cough up blood.  You have difficulty breathing.  Your heartbeat is very fast. This information is not intended to replace advice given to you by your health care provider. Make sure you discuss any questions you have with your health care provider. Document Released: 12/31/2010 Document Revised: 12/10/2015 Document Reviewed: 09/10/2014 Elsevier Interactive Patient Education  2017 [employer].    Methylprednisolone Suspension for Injection What is this medicine? METHYLPREDNISOLONE (meth ill pred NISS oh lone) is a corticosteroid. It is commonly used to treat inflammation of the skin, joints, lungs, and other organs. Common conditions treated include asthma, allergies, and arthritis. It is also used for other conditions, such as blood disorders and diseases of the adrenal glands. This medicine may be used for other purposes; ask your health care provider or pharmacist if you have questions. COMMON BRAND NAME(S): Depmedalone-40, Depmedalone-80, Depo-Medrol What should I tell my health care provider before I take this medicine? They need to know if you have any of these conditions: -Cushing's syndrome -eye disease, vision problems -diabetes -glaucoma -heart disease -high blood pressure -infection (especially a virus infection such as chickenpox, cold sores, or herpes) -liver disease -mental illness -myasthenia gravis -osteoporosis -recently received or scheduled to receive a vaccine -seizures -stomach or intestine  problems -thyroid disease -an unusual or allergic reaction to lactose, methylprednisolone, other medicines, foods, dyes, or preservatives -pregnant or trying to get pregnant -breast-feeding How should I use this medicine? This medicine is  for injection into a muscle, joint, or other tissue. It is given by a health care professional in a hospital or clinic setting. Talk to your pediatrician regarding the use of this medicine in children. While this drug may be prescribed for selected conditions, precautions do apply. Overdosage: If you think you have taken too much of this medicine contact a poison control center or emergency room at once. NOTE: This medicine is only for you. Do not share this medicine with others. What if I miss a dose? This does not apply. What may interact with this medicine? Do not take this medicine with any of the following medications: -alefacept -echinacea -iopamidol -live virus vaccines -metyrapone -mifepristone This medicine may also interact with the following medications: -amphotericin B -aspirin and aspirin-like medicines -certain antibiotics like erythromycin, clarithromycin, troleandomycin -certain medicines for diabetes -certain medicines for fungal infections like ketoconazole -certain medicines for seizures like carbamazepine, phenobarbital, phenytoin -certain medicines that treat or prevent blood clots like warfarin -cyclosporine -digoxin -diuretics -female hormones, like estrogens and birth control pills -isoniazid -NSAIDs, medicines for pain inflammation, like ibuprofen or naproxen -other medicines for myasthenia gravis -rifampin -vaccines This list may not describe all possible interactions. Give your health care provider a list of all the medicines, herbs, non-prescription drugs, or dietary supplements you use. Also tell them if you smoke, drink alcohol, or use illegal drugs. Some items may interact with your medicine. What should I watch for while using this medicine? Tell your doctor or healthcare professional if your symptoms do not start to get better or if they get worse. Do not stop taking except on your doctor's advice. You may develop a severe reaction. Your doctor will  tell you how much medicine to take. Your condition will be monitored carefully while you are receiving this medicine. This medicine may increase your risk of getting an infection. Tell your doctor or health care professional if you are around anyone with measles or chickenpox, or if you develop sores or blisters that do not heal properly. This medicine may affect blood sugar levels. If you have diabetes, check with your doctor or health care professional before you change your diet or the dose of your diabetic medicine. Tell your doctor or health care professional right away if you have any change in your eyesight. Using this medicine for a long time may increase your risk of low bone mass. Talk to your doctor about bone health. What side effects may I notice from receiving this medicine? Side effects that you should report to your doctor or health care professional as soon as possible: -allergic reactions like skin rash, itching or hives, swelling of the face, lips, or tongue -bloody or tarry stools -changes in vision -hallucination, loss of contact with reality -muscle cramps -muscle pain -palpitations -signs and symptoms of high blood sugar such as dizziness; dry mouth; dry skin; fruity breath; nausea; stomach pain; increased hunger or thirst; increased urination -signs and symptoms of infection like fever or chills; cough; sore throat; pain or trouble passing urine -trouble passing urine or change in the amount of urine Side effects that usually do not require medical attention (report to your doctor or health care professional if they continue or are bothersome): -changes in emotions or mood -constipation -diarrhea -excessive hair growth on the face or  body -headache -nausea, vomiting -pain, redness, or irritation at site where injected -trouble sleeping -weight gain This list may not describe all possible side effects. Call your doctor for medical advice about side effects. You may  report side effects to FDA at 1-800-FDA-1088. Where should I keep my medicine? This drug is given in a hospital or clinic and will not be stored at home. NOTE: This sheet is a summary. It may not cover all possible information. If you have questions about this medicine, talk to your doctor, pharmacist, or health care provider.  2018 Elsevier/Gold Standard (2015-09-10 16:17:02)     IF you received an x-ray today, you will receive an invoice from [facility]. Please contact [facility] at [phone] with questions or concerns regarding your invoice.   IF you received labwork today, you will receive an invoice from [facility]. Please contact [facility] at [phone] with questions or concerns regarding your invoice.   Our billing staff will not be able to assist you with questions regarding bills from these companies.  You will be contacted with the lab results as soon as they are available. The fastest way to get your results is to activate your My Chart account. Instructions are located on the last page of this paperwork. If you have not heard from [place] regarding the results in 2 weeks, please contact this office.

## 2016-09-23 NOTE — Progress Notes (Signed)
  MRN: [id number] DOB: 01/22/1956  Subjective:   Tracy Hardy is a 61 y.o. female presenting for chief complaint of Follow-up (needs an inhaler); Cough (states she is not better); and Sinusitis  Reports ongoing cough, chest tightness, chills, subjective fever, sore throat. She has finished azithromycin, is using cough syrup, Tessalon. Denies chest pain. Denies history of asthma, COPD.   Tracy Hardy has a current medication list which includes the following prescription(s): alprazolam, benzonatate, cetirizine, chlorpheniramine-hydrocodone, fluoxetine, omeprazole, paroxetine, and pseudoephedrine. Also is allergic to codeine.  Tracy Hardy  has a past medical history of Allergy and GERD (gastroesophageal reflux disease). Also  has a past surgical history that includes Cesarean section and Tubal ligation.  Objective:   Vitals: BP 138/90   Pulse 80   Temp 97.6 F (36.4 C) (Oral)   Resp 18   Ht 5\' 7"  (1.702 m)   Wt 169 lb 12.8 oz (77 kg)   SpO2 97%   BMI 26.59 kg/m   Physical Exam  Constitutional: She is oriented to person, place, and time. She appears well-developed and well-nourished.  HENT:  Mouth/Throat: Oropharynx is clear and moist.  Neck: Normal range of motion. Neck supple.  Cardiovascular: Normal rate, regular rhythm and intact distal pulses.  Exam reveals no gallop and no friction rub.   No murmur heard. Pulmonary/Chest: No respiratory distress. She has no wheezes. She has no rales.  Lymphadenopathy:    She has no cervical adenopathy.  Neurological: She is alert and oriented to person, place, and time.  Skin: Skin is warm and dry.   Dg Chest 2 View  Result Date: 09/23/2016 CLINICAL DATA:  Cough and chest tightness today. EXAM: CHEST  2 VIEW COMPARISON:  PA and lateral chest 02/21/2014. FINDINGS: Lungs are clear. Heart size is normal. No pneumothorax or pleural fluid. No bony abnormality. IMPRESSION: Negative chest. Electronically Signed   By: Inge Rise M.D.   On: 09/23/2016  16:01    Results for orders placed or performed in visit on 09/23/16 (from the past 24 hour(s))  POCT CBC     Status: Abnormal   Collection Time: 09/23/16  4:04 PM  Result Value Ref Range   WBC 6.3 4.6 - 10.2 K/uL   Lymph, poc 2.1 0.6 - 3.4   POC LYMPH PERCENT 33.3 10 - 50 %L   MID (cbc) 0.5 0 - 0.9   POC MID % 8.2 0 - 12 %M   POC Granulocyte 3.7 2 - 6.9   Granulocyte percent 58.5 37 - 80 %G   RBC 4.37 4.04 - 5.48 M/uL   Hemoglobin 14.0 12.2 - 16.2 g/dL   HCT, POC 39.7 37.7 - 47.9 %   MCV 90.8 80 - 97 fL   MCH, POC 32.0 (A) 27 - 31.2 pg   MCHC 35.2 31.8 - 35.4 g/dL   RDW, POC 12.4 %   Platelet Count, POC 245 142 - 424 K/uL   MPV 6.6 0 - 99.8 fL   Assessment and Plan :   1. Sore throat 2. Cough 3. Chest tightness - Patient refused further work up due to cost burden from her insurance policy. She reluctantly agreed to Depo-medrol. Will use albuterol inhaler if no improvement by Sunday. RTC thereafter for a recheck.  Tracy Eagles, PA-C [facility] [phone] 09/23/2016  3:35 PM

## 2016-09-29 ENCOUNTER — Ambulatory Visit (INDEPENDENT_AMBULATORY_CARE_PROVIDER_SITE_OTHER): Payer: 59 | Admitting: Urgent Care

## 2016-09-29 VITALS — BP 126/88 | HR 72 | Temp 98.5°F | Resp 16 | Ht 67.0 in | Wt 168.0 lb

## 2016-09-29 DIAGNOSIS — R0982 Postnasal drip: Secondary | ICD-10-CM

## 2016-09-29 DIAGNOSIS — Z9109 Other allergy status, other than to drugs and biological substances: Secondary | ICD-10-CM | POA: Diagnosis not present

## 2016-09-29 DIAGNOSIS — R0981 Nasal congestion: Secondary | ICD-10-CM

## 2016-09-29 DIAGNOSIS — R059 Cough, unspecified: Secondary | ICD-10-CM

## 2016-09-29 DIAGNOSIS — R05 Cough: Secondary | ICD-10-CM | POA: Diagnosis not present

## 2016-09-29 DIAGNOSIS — J029 Acute pharyngitis, unspecified: Secondary | ICD-10-CM | POA: Diagnosis not present

## 2016-09-29 MED ORDER — CETIRIZINE HCL 10 MG PO TABS: 10.0000 mg | ORAL_TABLET | Freq: Every day | ORAL | 3 refills | Status: DC

## 2016-09-29 NOTE — Patient Instructions (Addendum)
Allergies, Adult An allergy is when your body's defense system (immune system) overreacts to an otherwise harmless substance (allergen) that you breathe in or eat or something that touches your skin. When you come into contact with something that you are allergic to, your immune system produces certain proteins (antibodies). These proteins cause cells to release chemicals (histamines) that trigger the symptoms of an allergic reaction. Allergies often affect the nasal passages (allergic rhinitis), eyes (allergic conjunctivitis), skin (atopic dermatitis), and stomach. Allergies can be mild or severe. Allergies cannot spread from person to person (are not contagious). They can develop at any age and may be outgrown. What are the causes? Allergies can be caused by any substance that your immune system mistakenly targets as harmful. These may include:  Outdoor allergens, such as pollen, grass, weeds, car exhaust, and mold spores.  Indoor allergens, such as dust, smoke, mold, and pet dander.  Foods, especially peanuts, milk, eggs, fish, shellfish, soy, nuts, and wheat.  Medicines, such as penicillin.  Skin irritants, such as detergents, chemicals, and latex.  Perfume.  Insect bites or stings. What increases the risk? You may be at greater risk of allergies if other people in your family have allergies. What are the signs or symptoms? Symptoms depend on what type of allergy you have. They may include:  Runny, stuffy nose.  Sneezing.  Itchy mouth, ears, or throat.  Postnasal drip.  Sore throat.  Itchy, red, watery, or puffy eyes.  Skin rash or hives.  Stomach pain.  Vomiting.  Diarrhea.  Bloating.  Wheezing or coughing. People with a severe allergy to food, medicine, or an insect bite may have a life-threatening allergic reaction (anaphylaxis). Symptoms of anaphylaxis include:  Hives.  Itching.  Flushed face.  Swollen lips, tongue, or mouth.  Tight or swollen  throat.  Chest pain or tightness in the chest.  Trouble breathing or shortness of breath.  Rapid heartbeat.  Dizziness or fainting.  Vomiting.  Diarrhea.  Pain in the abdomen. How is this diagnosed? This condition is diagnosed based on:  Your symptoms.  Your family and medical history.  A physical exam. You may need to see a health care provider who specializes in treating allergies (allergist). You may also have tests, including:  Skin tests to see which allergens are causing your symptoms, such as:  Skin prick test. In this test, your skin is pricked with a tiny needle and exposed to small amounts of possible allergens to see if your skin reacts.  Intradermal skin test. In this test, a small amount of allergen is injected under your skin to see if your skin reacts.  Patch test. In this test, a small amount of allergen is placed on your skin and then your skin is covered with a bandage. Your health care provider will check your skin after a couple of days to see if a rash has developed.  Blood tests.  Challenges tests. In this test, you inhale a small amount of allergen by mouth to see if you have an allergic reaction. You may also be asked to:  Keep a food diary. A food diary is a record of all the foods and drinks you have in a day and any symptoms you experience.  Practice an elimination diet. An elimination diet involves eliminating specific foods from your diet and then adding them back in one by one to find out if a certain food causes an allergic reaction. How is this treated? Treatment for allergies depends on your symptoms.   Treatment may include:  Cold compresses to soothe itching and swelling.  Eye drops.  Nasal sprays.  Using a saline spray or container (neti pot) to flush out the nose (nasal irrigation). These methods can help clear away mucus and keep the nasal passages moist.  Using a humidifier.  Oral antihistamines or other medicines to block  allergic reaction and inflammation.  Skin creams to treat rashes or itching.  Diet changes to eliminate food allergy triggers.  Repeated exposure to tiny amounts of allergens to build up a tolerance and prevent future allergic reactions (immunotherapy). These include:  Allergy shots.  Oral treatment. This involves taking small doses of an allergen under the tongue (sublingual immunotherapy).  Emergency epinephrine injection (auto-injector) in case of an allergic emergency. This is a self-injectable, pre-measured medicine that must be given within the first few minutes of a serious allergic reaction. Follow these instructions at home:  Avoid known allergens whenever possible.  If you suffer from airborne allergens, wash out your nose daily. You can do this with a saline spray or a neti pot to flush out your nose (nasal irrigation).  Take over-the-counter and prescription medicines only as told by your health care provider.  Keep all follow-up visits as told by your health care provider. This is important.  If you are at risk of a severe allergic reaction (anaphylaxis), keep your auto-injector with you at all times.  If you have ever had anaphylaxis, wear a medical alert bracelet or necklace that states you have a severe allergy. Contact a health care provider if:  Your symptoms do not improve with treatment. Get help right away if:  You have symptoms of anaphylaxis, such as:  Swollen mouth, tongue, or throat.  Pain or tightness in your chest.  Trouble breathing or shortness of breath.  Dizziness or fainting.  Severe abdominal pain, vomiting, or diarrhea. This information is not intended to replace advice given to you by your health care provider. Make sure you discuss any questions you have with your health care provider. Document Released: 09/27/2002 Document Revised: 03/03/2016 Document Reviewed: 01/20/2016 Elsevier Interactive Patient Education  2017 [employer].     We recommend that you schedule a mammogram for breast cancer screening. Typically, you do not need a referral to do this. Please contact a local imaging center to schedule your mammogram.  [facility] - [phone]  *ask for the Radiology Department [facility]) - [phone] or [phone]  [facility] - [phone] [place] [phone] [facility] - [phone]  *ask for the [facility] - [phone]  *ask for the Radiology Department [facility] - [phone]  *ask for the [place] - [phone]    IF you received an x-ray today, you will receive an invoice from [facility]. Please contact [facility] at [phone] with questions or concerns regarding your invoice.   IF you received labwork today, you will receive an invoice from [facility]. Please contact [facility] at [phone] with questions or concerns regarding your invoice.   Our billing staff will not be able to assist you with questions regarding bills from these companies.  You will be contacted with the lab results as soon as they are available. The fastest way to get your results is to activate your My Chart account. Instructions are located on the last page of this paperwork. If you have not heard from [place]  regarding the results in 2 weeks, please contact this office.

## 2016-09-29 NOTE — Progress Notes (Signed)
    MRN: [id number] DOB: 05-Dec-1955  Subjective:   Tracy Hardy is a 61 y.o. female presenting for follow up on sore throat, cough. Patient received IM Depomedrol at her last visit on 09/23/2016 for persistent cough, chest tightness, congestion, post-nasal drainage and sore throat. Today, reports that all her symptoms are dramatically improved. She did not end up using the albuterol inhaler. Denies fever, sore throat, cough, chest pain, shob, wheezing, n/v, rashes. She plans on continuing Zyrtec or Allegra and using Sudafed as needed.   Tracy Hardy has a current medication list which includes the following prescription(s): alprazolam, cetirizine, fluoxetine, and omeprazole. Also is allergic to codeine.  Tracy Hardy  has a past medical history of Allergy and GERD (gastroesophageal reflux disease). Also  has a past surgical history that includes Cesarean section and Tubal ligation.  Objective:   Vitals: BP 126/88   Pulse 72   Temp 98.5 F (36.9 C) (Oral)   Resp 16   Ht 5\' 7"  (1.702 m)   Wt 168 lb (76.2 kg)   SpO2 98%   BMI 26.31 kg/m   Physical Exam  Constitutional: She is oriented to person, place, and time. She appears well-developed and well-nourished.  HENT:  Mild post-nasal drainage in oropharynx still present.  Cardiovascular: Normal rate, regular rhythm and intact distal pulses.  Exam reveals no gallop and no friction rub.   No murmur heard. Pulmonary/Chest: No respiratory distress. She has no wheezes. She has no rales.  Neurological: She is alert and oriented to person, place, and time.  Skin: Skin is warm and dry.   Assessment and Plan :   1. Environmental allergies 2. Sore throat 3. Cough 4. Sinus congestion 5. Post-nasal drainage - Resolved, continue oral antihistamine and Sudafed as needed. RTC if symptoms return.  Jaynee Eagles, PA-C [facility] [phone] 09/29/2016 4:36 PM

## 2017-04-26 DIAGNOSIS — L570 Actinic keratosis: Secondary | ICD-10-CM | POA: Diagnosis not present

## 2017-04-26 DIAGNOSIS — X32XXXD Exposure to sunlight, subsequent encounter: Secondary | ICD-10-CM | POA: Diagnosis not present

## 2017-04-26 DIAGNOSIS — C44629 Squamous cell carcinoma of skin of left upper limb, including shoulder: Secondary | ICD-10-CM | POA: Diagnosis not present

## 2017-05-31 DIAGNOSIS — Z08 Encounter for follow-up examination after completed treatment for malignant neoplasm: Secondary | ICD-10-CM | POA: Diagnosis not present

## 2017-05-31 DIAGNOSIS — Z85828 Personal history of other malignant neoplasm of skin: Secondary | ICD-10-CM | POA: Diagnosis not present

## 2017-05-31 DIAGNOSIS — H61002 Unspecified perichondritis of left external ear: Secondary | ICD-10-CM | POA: Diagnosis not present

## 2017-06-11 DIAGNOSIS — J0140 Acute pansinusitis, unspecified: Secondary | ICD-10-CM | POA: Diagnosis not present

## 2017-06-11 DIAGNOSIS — R05 Cough: Secondary | ICD-10-CM | POA: Diagnosis not present

## 2017-06-26 DIAGNOSIS — N3001 Acute cystitis with hematuria: Secondary | ICD-10-CM | POA: Diagnosis not present

## 2017-07-26 ENCOUNTER — Other Ambulatory Visit: Payer: Self-pay | Admitting: Podiatry

## 2017-07-26 ENCOUNTER — Ambulatory Visit: Payer: 59 | Admitting: Podiatry

## 2017-07-26 ENCOUNTER — Ambulatory Visit (INDEPENDENT_AMBULATORY_CARE_PROVIDER_SITE_OTHER): Payer: 59

## 2017-07-26 ENCOUNTER — Encounter: Payer: Self-pay | Admitting: Podiatry

## 2017-07-26 DIAGNOSIS — M21619 Bunion of unspecified foot: Secondary | ICD-10-CM | POA: Diagnosis not present

## 2017-07-26 DIAGNOSIS — M79671 Pain in right foot: Secondary | ICD-10-CM

## 2017-07-26 DIAGNOSIS — M722 Plantar fascial fibromatosis: Secondary | ICD-10-CM

## 2017-07-26 DIAGNOSIS — M79672 Pain in left foot: Secondary | ICD-10-CM

## 2017-07-26 MED ORDER — TRIAMCINOLONE ACETONIDE 10 MG/ML IJ SUSP
10.0000 mg | Freq: Once | INTRAMUSCULAR | Status: AC
  Administered 2017-07-26: 10 mg

## 2017-07-26 NOTE — Progress Notes (Signed)
Subjective:   Patient ID: Tracy Hardy, female   DOB: 62 y.o.   MRN: [id number]   HPI Patient presents with pain in the bottom of the heel and also around the back of the heel and arch with the bottom of the heel starting first times 3 months with also moderate structural deformity right over left   ROS      Objective:  Physical Exam  Structural tailor's bunion deformity and structural bunion deformity right with acute pain in the plantar fascia right at the insertional point of the tendon into the calcaneus     Assessment:  Acute plantar fasciitis right with moderate tailor's bunion and regular bunion deformity     Plan:  H&P conditions reviewed and injected the plantar fascia right 3 mg Kenalog 5 mg Xylocaine and instructed on wider shoes and discussed possibility for structural fifth metatarsal and first metatarsal procedure which may be necessary.  Dispense fascial brace and instructed on physical therapy and reappoint in 2 weeks  X-rays indicate a small plantar spur formation and also indicate that there is structural bunion and tailor's bunion deformity right

## 2017-07-26 NOTE — Patient Instructions (Signed)

## 2017-08-02 DIAGNOSIS — Z124 Encounter for screening for malignant neoplasm of cervix: Secondary | ICD-10-CM | POA: Diagnosis not present

## 2017-08-02 DIAGNOSIS — Z1231 Encounter for screening mammogram for malignant neoplasm of breast: Secondary | ICD-10-CM | POA: Diagnosis not present

## 2017-08-02 DIAGNOSIS — Z01419 Encounter for gynecological examination (general) (routine) without abnormal findings: Secondary | ICD-10-CM | POA: Diagnosis not present

## 2017-08-09 ENCOUNTER — Encounter: Payer: Self-pay | Admitting: Podiatry

## 2017-08-09 ENCOUNTER — Ambulatory Visit: Payer: 59 | Admitting: Podiatry

## 2017-08-09 DIAGNOSIS — M722 Plantar fascial fibromatosis: Secondary | ICD-10-CM

## 2017-08-10 NOTE — Progress Notes (Signed)
Subjective:   Patient ID: Tracy Hardy, female   DOB: 62 y.o.   MRN: [id number]   HPI Patient presents stating the heel is feeling a lot better with minimal discomfort and able to walk   ROS      Objective:  Physical Exam  Neurovascular status intact with discomfort plantar heel which is improved quite dramatically     Assessment:  Plantar fasciitis that improved     Plan:  Reviewed condition and recommended continued physical therapy supportive shoes and patient will be seen back as needed

## 2017-09-01 ENCOUNTER — Ambulatory Visit (INDEPENDENT_AMBULATORY_CARE_PROVIDER_SITE_OTHER): Payer: 59 | Admitting: Family Medicine

## 2017-09-01 ENCOUNTER — Encounter: Payer: Self-pay | Admitting: Family Medicine

## 2017-09-01 VITALS — BP 128/78 | HR 58 | Temp 97.3°F | Resp 18 | Ht 67.0 in | Wt 152.2 lb

## 2017-09-01 DIAGNOSIS — E785 Hyperlipidemia, unspecified: Secondary | ICD-10-CM

## 2017-09-01 DIAGNOSIS — R6889 Other general symptoms and signs: Secondary | ICD-10-CM | POA: Diagnosis not present

## 2017-09-01 DIAGNOSIS — Z0000 Encounter for general adult medical examination without abnormal findings: Secondary | ICD-10-CM

## 2017-09-01 DIAGNOSIS — R002 Palpitations: Secondary | ICD-10-CM | POA: Diagnosis not present

## 2017-09-01 DIAGNOSIS — K219 Gastro-esophageal reflux disease without esophagitis: Secondary | ICD-10-CM

## 2017-09-01 DIAGNOSIS — Z1321 Encounter for screening for nutritional disorder: Secondary | ICD-10-CM | POA: Diagnosis not present

## 2017-09-01 DIAGNOSIS — Z1329 Encounter for screening for other suspected endocrine disorder: Secondary | ICD-10-CM

## 2017-09-01 DIAGNOSIS — R350 Frequency of micturition: Secondary | ICD-10-CM

## 2017-09-01 LAB — POC MICROSCOPIC URINALYSIS (UMFC): Mucus: ABSENT

## 2017-09-01 LAB — POCT URINALYSIS DIP (MANUAL ENTRY)
BILIRUBIN UA: NEGATIVE
BILIRUBIN UA: NEGATIVE mg/dL
Blood, UA: NEGATIVE
Glucose, UA: NEGATIVE mg/dL
Leukocytes, UA: NEGATIVE
Nitrite, UA: NEGATIVE
PH UA: 6 (ref 5.0–8.0)
PROTEIN UA: NEGATIVE mg/dL
Urobilinogen, UA: 0.2 E.U./dL

## 2017-09-01 NOTE — Progress Notes (Signed)
Subjective:  By signing my name below, I, Moises Blood, attest that this documentation has been prepared under the direction and in the presence of Merri Ray, MD. Electronically Signed: Moises Blood, [place]. 09/01/2017 , 10:59 AM .  Patient was seen in Room 14 .   Patient ID: Tracy Hardy, female    DOB: 1955-08-31, 62 y.o.   MRN: [id number] Chief Complaint  Patient presents with  . Annual Exam    no pap  . Palpitations    x4 months   HPI IMOGINE CARVELL is a 63 y.o. female  She is a new patient to me. Here for annual physical. She been seen here previously for acute issues. Her last physical was done in Sept 2017 with Tenna Delaine, PA-C.   She works as Scientist, clinical (histocompatibility and immunogenetics) at [employer].   Urinary frequency She was seen for urinary symptoms in Dec 2018. She denies any symptoms today, except for urinary frequency but believes due to increased water intake.   Cold intolerance She's been feeling colder than usual. She denies fever or chills.   Palpitations She believes it's with anxiety, but recently more tachycardic that started about 4 months ago. She denies associated chest pain. She states it's present almost all day.   Bump over collar bone She also mentions bump over her left upper chest. She states she's had imaging done for this about 5 years ago.   Reflux She has a history of reflux, treated with Prilosec 40mg  PRN in the past. She takes it almost daily, but sometimes forgets to take it.   Cancer Screening Colonoscopy: last done in 2014.  Cervical cancer screening: last pap testing Aug 02, 2017, normal.  Breast cancer screening: mammogram done Jan 2019, dense tissue; repeat in 1 year.   Patient mentions sisters (63 and 62 years older) have had bone density testing. She hasn't had bone density done; mother (recently passed) had severe arthritis. She denies taking Vitamin D or calcium supplements.   Immunizations Immunization History  Administered  Date(s) Administered  . Influenza Split 05/01/2014  . Tdap 03/30/2012   Flu vaccines: 04/17/2017 at her employer.   Depression/anxiety Depression screen [facility] 2/9 09/01/2017 09/29/2016 09/23/2016 09/12/2016 04/07/2016  Decreased Interest 0 0 0 0 0  Down, Depressed, Hopeless 0 0 0 0 0  PHQ - 2 Score 0 0 0 0 0   Anxiety was discussed at last physical in Sept 2017. She's been treated for anxiety for 20 years now; has taken xanax and Paxil. She was on Paxil 10mg  QD and xanax 0.25-0.5mg  at night. She's tried to decrease to every other day but on nights she didn't take it, she was very shaky and tachycardia. She was referred to psychiatry (Dr. Toy Care) at that visit. Temporary refills were provided.   Vision  Visual Acuity Screening   Right eye Left eye Both eyes  Without correction:     With correction: 20/50-2 20/15-1 20/15-2   Last seen eye doctor in May 2018, seen annually.   Dentist Last seen dentist in Nov 2018, seen every 6 months.   Exercise She denies regular exercise.   Lipid screening Lab Results  Component Value Date   CHOL 208 (H) 04/07/2016   HDL 53 04/07/2016   LDLCALC 139 (H) 04/07/2016   TRIG 82 04/07/2016   CHOLHDL 3.9 04/07/2016   Discussed diet control at physical in Sept 2017.   Wt Readings from Last 3 Encounters:  09/01/17 152 lb 3.2 oz (69 kg)  09/29/16  168 lb (76.2 kg)  09/23/16 169 lb 12.8 oz (77 kg)    Patient Active Problem List   Diagnosis Date Noted  . Allergic rhinitis 04/07/2016  . Anxiety 04/07/2016  . GERD (gastroesophageal reflux disease) 04/07/2016   Past Medical History:  Diagnosis Date  . Allergy   . GERD (gastroesophageal reflux disease)    Past Surgical History:  Procedure Laterality Date  . CESAREAN SECTION    . TUBAL LIGATION     Allergies  Allergen Reactions  . Codeine    Prior to Admission medications   Medication Sig Start Date End Date Taking? Authorizing Provider  ALPRAZolam Duanne Moron) 0.5 MG tablet take ONE-HALF - 1 talet BY  MOUTH at bedtime AS NEEDED 05/18/16   Tenna Delaine D, PA-C  cetirizine (ZYRTEC) 10 MG tablet Take 1 tablet (10 mg total) by mouth daily. 09/29/16   Jaynee Eagles, PA-C  FLUoxetine (PROZAC) 10 MG tablet Take 10 mg by mouth daily.    [provider]  omeprazole (PRILOSEC) 40 MG capsule Take one tablet as needed. 05/10/16   Leonie Douglas, PA-C   Social History   Socioeconomic History  . Marital status: Single    Spouse name: Not on file  . Number of children: Not on file  . Years of education: Not on file  . Highest education level: Not on file  Social Needs  . Financial resource strain: Not on file  . Food insecurity - worry: Not on file  . Food insecurity - inability: Not on file  . Transportation needs - medical: Not on file  . Transportation needs - non-medical: Not on file  Occupational History  . Not on file  Tobacco Use  . Smoking status: Former Smoker    Last attempt to quit: 04/07/1993    Years since quitting: 24.4  . Smokeless tobacco: Never Used  Substance and Sexual Activity  . Alcohol use: Yes    Alcohol/week: 0.6 oz    Types: 1 Cans of beer per week  . Drug use: No  . Sexual activity: Yes    Birth control/protection: Surgical    Comment: married, monogamous  Other Topics Concern  . Not on file  Social History Narrative  . Not on file   Review of Systems 13 point ROS - positive for palpitations, urinary frequency, cold intolerance, palpitations, bump over collar bone     Objective:   Physical Exam  Constitutional: She is oriented to person, place, and time. She appears well-developed and well-nourished.  HENT:  Head: Normocephalic and atraumatic.  Right Ear: External ear normal.  Left Ear: External ear normal.  Mouth/Throat: Oropharynx is clear and moist.  Eyes: Conjunctivae are normal. Pupils are equal, round, and reactive to light.  Neck: Normal range of motion. Neck supple. No thyromegaly present.  Cardiovascular: Normal rate, regular  rhythm, normal heart sounds and intact distal pulses. Exam reveals no gallop and no friction rub.  No murmur heard. Pulmonary/Chest: Effort normal and breath sounds normal. No respiratory distress. She has no wheezes.  Abdominal: Soft. Bowel sounds are normal. There is no tenderness.  Musculoskeletal: Normal range of motion. She exhibits no edema or tenderness.  Slight bony prominence left [place] joint  Lymphadenopathy:    She has no cervical adenopathy.  Neurological: She is alert and oriented to person, place, and time.  Skin: Skin is warm and dry. No rash noted.  Psychiatric: She has a normal mood and affect. Her behavior is normal. Thought content normal.  Vitals:   09/01/17 1000  BP: 128/78  Pulse: (!) 58  Resp: 18  Temp: (!) 97.3 F (36.3 C)  TempSrc: Oral  SpO2: 99%  Weight: 152 lb 3.2 oz (69 kg)  Height: 5\' 7"  (1.702 m)   Results for orders placed or performed in visit on 09/01/17  POCT urinalysis dipstick  Result Value Ref Range   Color, UA yellow yellow   Clarity, UA clear clear   Glucose, UA negative negative mg/dL   Bilirubin, UA negative negative   Ketones, POC UA negative negative mg/dL   Spec Grav, UA <=1.005 (A) 1.010 - 1.025   Blood, UA negative negative   pH, UA 6.0 5.0 - 8.0   Protein Ur, POC negative negative mg/dL   Urobilinogen, UA 0.2 0.2 or 1.0 E.U./dL   Nitrite, UA Negative Negative   Leukocytes, UA Negative Negative  POCT Microscopic Urinalysis ([facility])  Result Value Ref Range   WBC,UR,HPF,POC None None WBC/hpf   RBC,UR,HPF,POC None None RBC/hpf   Bacteria None None, Too numerous to count   Mucus Absent Absent   Epithelial Cells, UR Per Microscopy None None, Too numerous to count cells/hpf   EKG: sinus rhythm rate 53, no acute findings.     Assessment & Plan:   CYLAH FANNIN is a 62 y.o. female Annual physical exam  - -anticipatory guidance as below in AVS, screening labs above. Health maintenance items as above in HPI  discussed/recommended as applicable.   Gastroesophageal reflux disease, esophagitis presence not specified Encounter for screening for nutritional disorder - Plan: Vitamin B12, Magnesium, Ferritin, Vitamin D, 25-hydroxy  - Trigger avoidance discussed with common foods listed on handout. If well controlled, can try spacing out PPI to every other day.   -Nutritional screening/deficiency screening with chronic use of PPI was performed  Urinary frequency - Plan: POCT urinalysis dipstick, POCT Microscopic Urinalysis ([facility]), Urine Culture  -Overall improved. Repeat testing in office reassuring, urine culture pending  Cold intolerance - Plan: TSH Screening for thyroid disorder  -Screen for thyroid disorder initially with TSH, follow-up to discuss further  Heart palpitations - Plan: EKG 12-Lead, CBC, TSH  -Reassuring EKG. Check CBC, TSH, follow-up to discuss symptoms further, and possible cardiology evaluation/monitoring  Hyperlipidemia, unspecified hyperlipidemia type - Plan: Lipid panel, Comprehensive metabolic panel  Prominence noted at left sternoclavicular joint. Plans to follow-up for her palpitations, other symptoms above, and can discuss that area,/reexamine further at that time.  No orders of the defined types were placed in this encounter.  Patient Instructions   Thank you for coming in today. I will check some screening lab work for the heart palpitations, thyroid, other electrolytes as we discussed and cholesterol. Please follow-up with me in the next few weeks to review these results and discuss your heart palpitations and cold symptoms further. We can also discuss the bump on your collarbone further at that time as well as review previous imaging to decide if repeat imaging is needed.  Okay to try to space out heartburn medicine to every other day to see if that is just as effective for heartburn treatment. See list of foods below to avoid.   Food Choices for Gastroesophageal  Reflux Disease, Adult When you have gastroesophageal reflux disease (GERD), the foods you eat and your eating habits are very important. Choosing the right foods can help ease your discomfort. What guidelines do I need to follow?  Choose fruits, vegetables, whole grains, and low-fat dairy products.  Choose low-fat meat, fish, and poultry.  Limit fats such as oils, salad dressings, butter, nuts, and avocado.  Keep a food diary. This helps you identify foods that cause symptoms.  Avoid foods that cause symptoms. These may be different for everyone.  Eat small meals often instead of 3 large meals a day.  Eat your meals slowly, in a place where you are relaxed.  Limit fried foods.  Cook foods using methods other than frying.  Avoid drinking alcohol.  Avoid drinking large amounts of liquids with your meals.  Avoid bending over or lying down until 2-3 hours after eating. What foods are not recommended? These are some foods and drinks that may make your symptoms worse: Vegetables Tomatoes. Tomato juice. Tomato and spaghetti sauce. Chili peppers. Onion and garlic. Horseradish. Fruits Oranges, grapefruit, and lemon (fruit and juice). Meats High-fat meats, fish, and poultry. This includes hot dogs, ribs, ham, sausage, salami, and bacon. Dairy Whole milk and chocolate milk. Sour cream. Cream. Butter. Ice cream. Cream cheese. Drinks Coffee and tea. Bubbly (carbonated) drinks or energy drinks. Condiments Hot sauce. Barbecue sauce. Sweets/Desserts Chocolate and cocoa. Donuts. Peppermint and spearmint. Fats and Oils High-fat foods. This includes [place] fries and potato chips. Other Vinegar. Strong spices. This includes black pepper, white pepper, red pepper, cayenne, curry powder, cloves, ginger, and chili powder. The items listed above may not be a complete list of foods and drinks to avoid. Contact your dietitian for more information. This information is not intended to replace  advice given to you by your health care provider. Make sure you discuss any questions you have with your health care provider. Document Released: 01/03/2012 Document Revised: 12/10/2015 Document Reviewed: 05/08/2013 Elsevier Interactive Patient Education  2017 [employer].  Palpitations A palpitation is the feeling that your heartbeat is irregular or is faster than normal. It may feel like your heart is fluttering or skipping a beat. Palpitations are usually not a serious problem. They may be caused by many things, including smoking, caffeine, alcohol, stress, and certain medicines. Although most causes of palpitations are not serious, palpitations can be a sign of a serious medical problem. In some cases, you may need further medical evaluation. Follow these instructions at home: Pay attention to any changes in your symptoms. Take these actions to help with your condition:  Avoid the following: ? Caffeinated coffee, tea, soft drinks, diet pills, and energy drinks. ? Chocolate. ? Alcohol.  Do not use any tobacco products, such as cigarettes, chewing tobacco, and e-cigarettes. If you need help quitting, ask your health care provider.  Try to reduce your stress and anxiety. Things that can help you relax include: ? Yoga. ? Meditation. ? Physical activity, such as swimming, jogging, or walking. ? Biofeedback. This is a method that helps you learn to use your mind to control things in your body, such as your heartbeats.  Get plenty of rest and sleep.  Take over-the-counter and prescription medicines only as told by your health care provider.  Keep all follow-up visits as told by your health care provider. This is important.  Contact a health care provider if:  You continue to have a fast or irregular heartbeat after 24 hours.  Your palpitations occur more often. Get help right away if:  You have chest pain or shortness of breath.  You have a severe headache.  You feel dizzy or  you faint. This information is not intended to replace advice given to you by your health care provider. Make sure you discuss any questions you have with  your health care provider. Document Released: 07/01/2000 Document Revised: 12/07/2015 Document Reviewed: 03/19/2015 Elsevier Interactive Patient Education  [employer].    Return to the clinic or go to the nearest emergency room if any of your symptoms worsen or new symptoms occur.   Keeping You Healthy  Get These Tests  Blood Pressure- Have your blood pressure checked by your healthcare provider at least once a year.  Normal blood pressure is 120/80.  Weight- Have your body mass index (BMI) calculated to screen for obesity.  BMI is a measure of body fat based on height and weight.  You can calculate your own BMI at [employer].it  Cholesterol- Have your cholesterol checked every year.  Diabetes- Have your blood sugar checked every year if you have high blood pressure, high cholesterol, a family history of diabetes or if you are overweight.  Pap Test - Have a pap test every 1 to 5 years if you have been sexually active.  If you are older than 65 and recent pap tests have been normal you may not need additional pap tests.  In addition, if you have had a hysterectomy  for benign disease additional pap tests are not necessary.  Mammogram-Yearly mammograms are essential for early detection of breast cancer  Screening for Colon Cancer- Colonoscopy starting at age 4. Screening may begin sooner depending on your family history and other health conditions.  Follow up colonoscopy as directed by your Gastroenterologist.  Screening for Osteoporosis- Screening begins at age 68 with bone density scanning, sooner if you are at higher risk for developing Osteoporosis.  Get these medicines  Calcium with Vitamin D- Your body requires 1200-1500 mg of Calcium a day and [phone] IU of Vitamin D a day.  You can only absorb 500 mg of  Calcium at a time therefore Calcium must be taken in 2 or 3 separate doses throughout the day.  Hormones- Hormone therapy has been associated with increased risk for certain cancers and heart disease.  Talk to your healthcare provider about if you need relief from menopausal symptoms.  Aspirin- Ask your healthcare provider about taking Aspirin to prevent Heart Disease and Stroke.  Get these Immuniztions  Flu shot- Every fall  Pneumonia shot- Once after the age of 38; if you are younger ask your healthcare provider if you need a pneumonia shot.  Tetanus- Every ten years.  Zostavax- Once after the age of 67 to prevent shingles.  Take these steps  Don't smoke- Your healthcare provider can help you quit. For tips on how to quit, ask your healthcare provider or go to www.smokefree.gov or call 1-800 QUIT-NOW.  Be physically active- Exercise 5 days a week for a minimum of 30 minutes.  If you are not already physically active, start slow and gradually work up to 30 minutes of moderate physical activity.  Try walking, dancing, bike riding, swimming, etc.  Eat a healthy diet- Eat a variety of healthy foods such as fruits, vegetables, whole grains, low fat milk, low fat cheeses, yogurt, lean meats, chicken, fish, eggs, dried beans, tofu, etc.  For more information go to www.thenutritionsource.org  Dental visit- Brush and floss teeth twice daily; visit your dentist twice a year.  Eye exam- Visit your Optometrist or Ophthalmologist yearly.  Drink alcohol in moderation- Limit alcohol intake to one drink or less a day.  Never drink and drive.  Depression- Your emotional health is as important as your physical health.  If you're feeling down or losing interest in things  you normally enjoy, please talk to your healthcare provider.  Seat Belts- can save your life; always wear one  Smoke/Carbon Monoxide detectors- These detectors need to be installed on the appropriate level of your home.  Replace  batteries at least once a year.  Violence- If anyone is threatening or hurting you, please tell your healthcare provider.  Living Will/ Health care power of attorney- Discuss with your healthcare provider and family.    IF you received an x-ray today, you will receive an invoice from [facility]. Please contact [facility] at [phone] with questions or concerns regarding your invoice.   IF you received labwork today, you will receive an invoice from [facility]. Please contact [facility] at [phone] with questions or concerns regarding your invoice.   Our billing staff will not be able to assist you with questions regarding bills from these companies.  You will be contacted with the lab results as soon as they are available. The fastest way to get your results is to activate your My Chart account. Instructions are located on the last page of this paperwork. If you have not heard from [place] regarding the results in 2 weeks, please contact this office.       I personally performed the services described in this documentation, which was scribed in my presence. The recorded information has been reviewed and considered for accuracy and completeness, addended by me as needed, and agree with information above.  Signed,   Merri Ray, MD [facility].  09/02/17 10:41 PM

## 2017-09-01 NOTE — Patient Instructions (Addendum)
Thank you for coming in today. I will check some screening lab work for the heart palpitations, thyroid, other electrolytes as we discussed and cholesterol. Please follow-up with me in the next few weeks to review these results and discuss your heart palpitations and cold symptoms further. We can also discuss the bump on your collarbone further at that time as well as review previous imaging to decide if repeat imaging is needed.  Okay to try to space out heartburn medicine to every other day to see if that is just as effective for heartburn treatment. See list of foods below to avoid.   Food Choices for Gastroesophageal Reflux Disease, Adult When you have gastroesophageal reflux disease (GERD), the foods you eat and your eating habits are very important. Choosing the right foods can help ease your discomfort. What guidelines do I need to follow?  Choose fruits, vegetables, whole grains, and low-fat dairy products.  Choose low-fat meat, fish, and poultry.  Limit fats such as oils, salad dressings, butter, nuts, and avocado.  Keep a food diary. This helps you identify foods that cause symptoms.  Avoid foods that cause symptoms. These may be different for everyone.  Eat small meals often instead of 3 large meals a day.  Eat your meals slowly, in a place where you are relaxed.  Limit fried foods.  Cook foods using methods other than frying.  Avoid drinking alcohol.  Avoid drinking large amounts of liquids with your meals.  Avoid bending over or lying down until 2-3 hours after eating. What foods are not recommended? These are some foods and drinks that may make your symptoms worse: Vegetables Tomatoes. Tomato juice. Tomato and spaghetti sauce. Chili peppers. Onion and garlic. Horseradish. Fruits Oranges, grapefruit, and lemon (fruit and juice). Meats High-fat meats, fish, and poultry. This includes hot dogs, ribs, ham, sausage, salami, and bacon. Dairy Whole milk and chocolate  milk. Sour cream. Cream. Butter. Ice cream. Cream cheese. Drinks Coffee and tea. Bubbly (carbonated) drinks or energy drinks. Condiments Hot sauce. Barbecue sauce. Sweets/Desserts Chocolate and cocoa. Donuts. Peppermint and spearmint. Fats and Oils High-fat foods. This includes [place] fries and potato chips. Other Vinegar. Strong spices. This includes black pepper, white pepper, red pepper, cayenne, curry powder, cloves, ginger, and chili powder. The items listed above may not be a complete list of foods and drinks to avoid. Contact your dietitian for more information. This information is not intended to replace advice given to you by your health care provider. Make sure you discuss any questions you have with your health care provider. Document Released: 01/03/2012 Document Revised: 12/10/2015 Document Reviewed: 05/08/2013 Elsevier Interactive Patient Education  2017 [employer].  Palpitations A palpitation is the feeling that your heartbeat is irregular or is faster than normal. It may feel like your heart is fluttering or skipping a beat. Palpitations are usually not a serious problem. They may be caused by many things, including smoking, caffeine, alcohol, stress, and certain medicines. Although most causes of palpitations are not serious, palpitations can be a sign of a serious medical problem. In some cases, you may need further medical evaluation. Follow these instructions at home: Pay attention to any changes in your symptoms. Take these actions to help with your condition:  Avoid the following: ? Caffeinated coffee, tea, soft drinks, diet pills, and energy drinks. ? Chocolate. ? Alcohol.  Do not use any tobacco products, such as cigarettes, chewing tobacco, and e-cigarettes. If you need help quitting, ask your health care provider.  Try  to reduce your stress and anxiety. Things that can help you relax include: ? Yoga. ? Meditation. ? Physical activity, such as swimming,  jogging, or walking. ? Biofeedback. This is a method that helps you learn to use your mind to control things in your body, such as your heartbeats.  Get plenty of rest and sleep.  Take over-the-counter and prescription medicines only as told by your health care provider.  Keep all follow-up visits as told by your health care provider. This is important.  Contact a health care provider if:  You continue to have a fast or irregular heartbeat after 24 hours.  Your palpitations occur more often. Get help right away if:  You have chest pain or shortness of breath.  You have a severe headache.  You feel dizzy or you faint. This information is not intended to replace advice given to you by your health care provider. Make sure you discuss any questions you have with your health care provider. Document Released: 07/01/2000 Document Revised: 12/07/2015 Document Reviewed: 03/19/2015 Elsevier Interactive Patient Education  [employer].    Return to the clinic or go to the nearest emergency room if any of your symptoms worsen or new symptoms occur.   Keeping You Healthy  Get These Tests  Blood Pressure- Have your blood pressure checked by your healthcare provider at least once a year.  Normal blood pressure is 120/80.  Weight- Have your body mass index (BMI) calculated to screen for obesity.  BMI is a measure of body fat based on height and weight.  You can calculate your own BMI at [employer]  Cholesterol- Have your cholesterol checked every year.  Diabetes- Have your blood sugar checked every year if you have high blood pressure, high cholesterol, a family history of diabetes or if you are overweight.  Pap Test - Have a pap test every 1 to 5 years if you have been sexually active.  If you are older than 65 and recent pap tests have been normal you may not need additional pap tests.  In addition, if you have had a hysterectomy  for benign disease additional pap  tests are not necessary.  Mammogram-Yearly mammograms are essential for early detection of breast cancer  Screening for Colon Cancer- Colonoscopy starting at age 28. Screening may begin sooner depending on your family history and other health conditions.  Follow up colonoscopy as directed by your Gastroenterologist.  Screening for Osteoporosis- Screening begins at age 51 with bone density scanning, sooner if you are at higher risk for developing Osteoporosis.  Get these medicines  Calcium with Vitamin D- Your body requires 1200-1500 mg of Calcium a day and [phone] IU of Vitamin D a day.  You can only absorb 500 mg of Calcium at a time therefore Calcium must be taken in 2 or 3 separate doses throughout the day.  Hormones- Hormone therapy has been associated with increased risk for certain cancers and heart disease.  Talk to your healthcare provider about if you need relief from menopausal symptoms.  Aspirin- Ask your healthcare provider about taking Aspirin to prevent Heart Disease and Stroke.  Get these Immuniztions  Flu shot- Every fall  Pneumonia shot- Once after the age of 71; if you are younger ask your healthcare provider if you need a pneumonia shot.  Tetanus- Every ten years.  Zostavax- Once after the age of 65 to prevent shingles.  Take these steps  Don't smoke- Your healthcare provider can help you quit. For tips on  how to quit, ask your healthcare provider or go to www.smokefree.gov or call 1-800 QUIT-NOW.  Be physically active- Exercise 5 days a week for a minimum of 30 minutes.  If you are not already physically active, start slow and gradually work up to 30 minutes of moderate physical activity.  Try walking, dancing, bike riding, swimming, etc.  Eat a healthy diet- Eat a variety of healthy foods such as fruits, vegetables, whole grains, low fat milk, low fat cheeses, yogurt, lean meats, chicken, fish, eggs, dried beans, tofu, etc.  For more information go to  www.thenutritionsource.org  Dental visit- Brush and floss teeth twice daily; visit your dentist twice a year.  Eye exam- Visit your Optometrist or Ophthalmologist yearly.  Drink alcohol in moderation- Limit alcohol intake to one drink or less a day.  Never drink and drive.  Depression- Your emotional health is as important as your physical health.  If you're feeling down or losing interest in things you normally enjoy, please talk to your healthcare provider.  Seat Belts- can save your life; always wear one  Smoke/Carbon Monoxide detectors- These detectors need to be installed on the appropriate level of your home.  Replace batteries at least once a year.  Violence- If anyone is threatening or hurting you, please tell your healthcare provider.  Living Will/ Health care power of attorney- Discuss with your healthcare provider and family.    IF you received an x-ray today, you will receive an invoice from [facility]. Please contact [facility] at [phone] with questions or concerns regarding your invoice.   IF you received labwork today, you will receive an invoice from [facility]. Please contact [facility] at [phone] with questions or concerns regarding your invoice.   Our billing staff will not be able to assist you with questions regarding bills from these companies.  You will be contacted with the lab results as soon as they are available. The fastest way to get your results is to activate your My Chart account. Instructions are located on the last page of this paperwork. If you have not heard from [place] regarding the results in 2 weeks, please contact this office.

## 2017-09-02 LAB — MAGNESIUM: Magnesium: 2.7 mg/dL — ABNORMAL HIGH (ref 1.6–2.3)

## 2017-09-02 LAB — COMPREHENSIVE METABOLIC PANEL
A/G RATIO: 2.1 (ref 1.2–2.2)
ALK PHOS: 57 IU/L (ref 39–117)
ALT: 15 IU/L (ref 0–32)
AST: 15 IU/L (ref 0–40)
Albumin: 4.4 g/dL (ref 3.6–4.8)
BUN/Creatinine Ratio: 13 (ref 12–28)
BUN: 12 mg/dL (ref 8–27)
Bilirubin Total: 0.3 mg/dL (ref 0.0–1.2)
CO2: 22 mmol/L (ref 20–29)
CREATININE: 0.94 mg/dL (ref 0.57–1.00)
Calcium: 8.9 mg/dL (ref 8.7–10.3)
Chloride: 102 mmol/L (ref 96–106)
GFR calc Af Amer: 76 mL/min/{1.73_m2} (ref >59)
GFR, EST NON AFRICAN AMERICAN: 66 mL/min/{1.73_m2} (ref >59)
Globulin, Total: 2.1 g/dL (ref 1.5–4.5)
Glucose: 89 mg/dL (ref 65–99)
Potassium: 4 mmol/L (ref 3.5–5.2)
SODIUM: 140 mmol/L (ref 134–144)
TOTAL PROTEIN: 6.5 g/dL (ref 6.0–8.5)

## 2017-09-02 LAB — LIPID PANEL
CHOL/HDL RATIO: 4 ratio (ref 0.0–4.4)
Cholesterol, Total: 206 mg/dL — ABNORMAL HIGH (ref 100–199)
HDL: 52 mg/dL (ref >39)
LDL CALC: 134 mg/dL — AB (ref 0–99)
Triglycerides: 101 mg/dL (ref 0–149)
VLDL CHOLESTEROL CAL: 20 mg/dL (ref 5–40)

## 2017-09-02 LAB — CBC
HEMATOCRIT: 41.4 % (ref 34.0–46.6)
Hemoglobin: 13.8 g/dL (ref 11.1–15.9)
MCH: 31.6 pg (ref 26.6–33.0)
MCHC: 33.3 g/dL (ref 31.5–35.7)
MCV: 95 fL (ref 79–97)
PLATELETS: 233 10*3/uL (ref 150–379)
RBC: 4.37 x10E6/uL (ref 3.77–5.28)
RDW: 13.5 % (ref 12.3–15.4)
WBC: 5.1 10*3/uL (ref 3.4–10.8)

## 2017-09-02 LAB — VITAMIN B12: VITAMIN B 12: 551 pg/mL (ref 232–1245)

## 2017-09-02 LAB — FERRITIN: FERRITIN: 72 ng/mL (ref 15–150)

## 2017-09-02 LAB — URINE CULTURE

## 2017-09-02 LAB — VITAMIN D 25 HYDROXY (VIT D DEFICIENCY, FRACTURES): VIT D 25 HYDROXY: 25.7 ng/mL — AB (ref 30.0–100.0)

## 2017-09-02 LAB — TSH: TSH: 1.49 u[IU]/mL (ref 0.450–4.500)

## 2017-09-14 ENCOUNTER — Encounter: Payer: Self-pay | Admitting: Family Medicine

## 2017-09-14 ENCOUNTER — Ambulatory Visit: Payer: 59 | Admitting: Family Medicine

## 2017-09-14 ENCOUNTER — Other Ambulatory Visit: Payer: Self-pay

## 2017-09-14 VITALS — BP 122/78 | HR 89 | Temp 98.0°F | Resp 16 | Ht 64.17 in | Wt 147.0 lb

## 2017-09-14 DIAGNOSIS — Z8739 Personal history of other diseases of the musculoskeletal system and connective tissue: Secondary | ICD-10-CM | POA: Diagnosis not present

## 2017-09-14 DIAGNOSIS — R002 Palpitations: Secondary | ICD-10-CM | POA: Diagnosis not present

## 2017-09-14 DIAGNOSIS — E041 Nontoxic single thyroid nodule: Secondary | ICD-10-CM

## 2017-09-14 DIAGNOSIS — R7989 Other specified abnormal findings of blood chemistry: Secondary | ICD-10-CM

## 2017-09-14 NOTE — Progress Notes (Addendum)
Subjective:  By signing my name below, I, Tracy Hardy, attest that this documentation has been prepared under the direction and in the presence of Tracy Raspberry Ranell Patrick, MD.  Electronically Signed: Theresia Hardy, Medical Scribe 09/14/17 at 6:37 PM   Patient ID: Tracy Hardy, female    DOB: 01-19-1956, 62 y.o.   MRN: [id number] Chief Complaint  Patient presents with  . Palpitations    follow-up from OV 2/14   HPI Tracy Hardy is a 62 y.o. female who presents to Primary Care at [facility] for follow up of palpitations. She was last seen on 09/01/17 for a complete physical plus other concerns. She did note some recent cold intolerance, episodic heart palpitations, and fast HR.   Palpitations At last visit she had a reassuring EKG and blood work was obtained.   Today, she notes she has had the similar symptoms of fast HR since her past visit 2 weeks ago. She reports that this usually occurs on average once a day. She notes she has had a lot of stress lately and that is contributing to her physical condition. Per pt, she states she feels a "flip" in her heart beat that causes her to cough. She has agreed to be referred to a cardiologist. She denies fever or night sweats.    Anxiety She takes 20 mg Prozac QD per Dr. Robina Ade since Dec 2018. She also takes Xanax PRN. She is meeting with a counselor through [facility] regularly. Her mother recently passed away 2 months ago and this is contributing to her stress and anxiety. She reports a mildly decreased appetite. She reports she is not exercising much right now due to the weather but she has an elliptical exercise machine at home. She denies sleep disturbance.   Tiny thyroid nodule Previously planned to discuss further at this visit. She had a [place] in 04/2013 in left inferior pole thyroid nodule of 8.7 mm. It did not meet criteria for biopsy. She had a CXR on 09/23/16 that revealed no bony abnormalities. She states the area of concern has been present for  5-6 years and she is unsure if it has grown.   Bone health She has never had a DEXA. She had a low Vitamin D level previously on 09/01/17 and has since started a 1200 mg Vitamin D supplement. She has a FMHx of osteoporosis. She denies any unexplained fractures.   GERD She reports she is taking Prilosec every other day currently.   Patient Active Problem List   Diagnosis Date Noted  . Allergic rhinitis 04/07/2016  . Anxiety 04/07/2016  . GERD (gastroesophageal reflux disease) 04/07/2016   Past Medical History:  Diagnosis Date  . Allergy   . GERD (gastroesophageal reflux disease)    Past Surgical History:  Procedure Laterality Date  . CESAREAN SECTION    . TUBAL LIGATION     Allergies  Allergen Reactions  . Codeine    Prior to Admission medications   Medication Sig Start Date End Date Taking? Authorizing Provider  ALPRAZolam Duanne Moron) 0.5 MG tablet take ONE-HALF - 1 talet BY MOUTH at bedtime AS NEEDED 05/18/16   Tenna Delaine D, PA-C  FLUoxetine (PROZAC) 10 MG tablet Take 20 mg by mouth daily.     [provider]  omeprazole (PRILOSEC) 40 MG capsule Take one tablet as needed. 05/10/16   Leonie Douglas, PA-C   Social History   Socioeconomic History  . Marital status: Single    Spouse name: Not on file  .  Number of children: Not on file  . Years of education: Not on file  . Highest education level: Not on file  Social Needs  . Financial resource strain: Not on file  . Food insecurity - worry: Not on file  . Food insecurity - inability: Not on file  . Transportation needs - medical: Not on file  . Transportation needs - non-medical: Not on file  Occupational History  . Not on file  Tobacco Use  . Smoking status: Former Smoker    Last attempt to quit: 04/07/1993    Years since quitting: 24.4  . Smokeless tobacco: Never Used  Substance and Sexual Activity  . Alcohol use: Yes    Alcohol/week: 0.6 oz    Types: 1 Cans of beer per week  . Drug use: No  .  Sexual activity: Yes    Birth control/protection: Surgical    Comment: married, monogamous  Other Topics Concern  . Not on file  Social History Narrative  . Not on file   Review of Systems  Constitutional: Positive for appetite change (mildly decreased). Negative for diaphoresis and fever.  Cardiovascular: Positive for palpitations.       (+) Tachycardia  Psychiatric/Behavioral: Negative for sleep disturbance. The patient is nervous/anxious.        Objective:   Physical Exam  Constitutional: She is oriented to person, place, and time. She appears well-developed and well-nourished.  HENT:  Head: Normocephalic and atraumatic.  Eyes: Conjunctivae and EOM are normal. Pupils are equal, round, and reactive to light.  Neck: Carotid bruit is not present.  Prominence of the left [place] joint  Cardiovascular: Normal rate, regular rhythm, normal heart sounds and intact distal pulses. Exam reveals no gallop and no friction rub.  No murmur heard. Pulmonary/Chest: Effort normal and breath sounds normal.  Abdominal: Soft. She exhibits no pulsatile midline mass. There is no tenderness.  Neurological: She is alert and oriented to person, place, and time.  Skin: Skin is warm and dry.  Psychiatric: She has a normal mood and affect. Her behavior is normal.  Vitals reviewed.   Vitals:   09/14/17 1714  BP: 122/78  Pulse: 89  Resp: 16  Temp: 98 F (36.7 C)  TempSrc: Oral  SpO2: 98%  Weight: 147 lb (66.7 kg)  Height: 5' 4.17" (1.63 m)      Assessment & Plan:   Tracy Hardy is a 62 y.o. female Palpitations - Plan: Ambulatory referral to Cardiology  - Appear to  coexist with anxiety/stressful times. Also describes possible symptoms of PVCs. Previous blood work reassuring. Continue follow-up with psychiatrist, and counseling through hospice  -Low intensity exercise option to help treat stress management, stop if any worsening palpitations  -Handout given on palpitations, cardiology eval  ordered as may be candidate for outpatient monitoring/Holter. RTC precautions if worsening  Thyroid nodule - Plan: US Soft Tissue Head/Neck  -Prior small thyroid nodule with recommended follow-up. Recheck ultrasound.  Low vitamin D level  -Discussed ver-the-counter calcium and vitamin D supplement given family history of osteoporosis low vitamin D on previous testing. 2000 units per day for now. Consider repeat testing in 6 weeks.  Recheck 4-6 weeks. Did note prominence on left [place] joint, possible arthritic changes, plan on imaging next visit as this has been present for some time. Chest x-ray without bony findings in March 2018. RTC precautions if increased symptoms or enlarging  No orders of the defined types were placed in this encounter.  Patient Instructions   OK  to continue vitamin D supplement - goal of about 2000 units per day. Calcium 1200-1542m per day.  I will check into osteoporosis screening.   Continue plan from Dr. KToy Carefor anxiety symptoms, and counseling with Hospice. I will refer you to cardiology for the palpitations. Try ellipitical goal of few days per week - LOW intensity to start.   Recheck weight next time to see if there is a continued down trend.   I will order ultrasound of neck for follow up thyroid nodule, but then can look at collarbone next visit.   Follow up in 1 month.  Return to the clinic or go to the nearest emergency room if any of your symptoms worsen or new symptoms occur.   Stress and Stress Management Stress is a normal reaction to life events. It is what you feel when life demands more than you are used to or more than you can handle. Some stress can be useful. For example, the stress reaction can help you catch the last bus of the day, study for a test, or meet a deadline at work. But stress that occurs too often or for too long can cause problems. It can affect your emotional health and interfere with relationships and normal daily activities.  Too much stress can weaken your immune system and increase your risk for physical illness. If you already have a medical problem, stress can make it worse. What are the causes? All sorts of life events may cause stress. An event that causes stress for one person may not be stressful for another person. Major life events commonly cause stress. These may be positive or negative. Examples include losing your job, moving into a new home, getting married, having a baby, or losing a loved one. Less obvious life events may also cause stress, especially if they occur day after day or in combination. Examples include working long hours, driving in traffic, caring for children, being in debt, or being in a difficult relationship. What are the signs or symptoms? Stress may cause emotional symptoms including, the following:  Anxiety. This is feeling worried, afraid, on edge, overwhelmed, or out of control.  Anger. This is feeling irritated or impatient.  Depression. This is feeling sad, down, helpless, or guilty.  Difficulty focusing, remembering, or making decisions.  Stress may cause physical symptoms, including the following:  Aches and pains. These may affect your head, neck, back, stomach, or other areas of your body.  Tight muscles or clenched jaw.  Low energy or trouble sleeping.  Stress may cause unhealthy behaviors, including the following:  Eating to feel better (overeating) or skipping meals.  Sleeping too little, too much, or both.  Working too much or putting off tasks (procrastination).  Smoking, drinking alcohol, or using drugs to feel better.  How is this diagnosed? Stress is diagnosed through an assessment by your health care provider. Your health care provider will ask questions about your symptoms and any stressful life events.Your health care provider will also ask about your medical history and may order blood tests or other tests. Certain medical conditions and medicine  can cause physical symptoms similar to stress. Mental illness can cause emotional symptoms and unhealthy behaviors similar to stress. Your health care provider may refer you to a mental health professional for further evaluation. How is this treated? Stress management is the recommended treatment for stress.The goals of stress management are reducing stressful life events and coping with stress in healthy ways. Techniques for reducing stressful life  events include the following:  Stress identification. Self-monitor for stress and identify what causes stress for you. These skills may help you to avoid some stressful events.  Time management. Set your priorities, keep a calendar of events, and learn to say "no." These tools can help you avoid making too many commitments.  Techniques for coping with stress include the following:  Rethinking the problem. Try to think realistically about stressful events rather than ignoring them or overreacting. Try to find the positives in a stressful situation rather than focusing on the negatives.  Exercise. Physical exercise can release both physical and emotional tension. The key is to find a form of exercise you enjoy and do it regularly.  Relaxation techniques. These relax the body and mind. Examples include yoga, meditation, tai chi, biofeedback, deep breathing, progressive muscle relaxation, listening to music, being out in nature, journaling, and other hobbies. Again, the key is to find one or more that you enjoy and can do regularly.  Healthy lifestyle. Eat a balanced diet, get plenty of sleep, and do not smoke. Avoid using alcohol or drugs to relax.  Strong support network. Spend time with family, friends, or other people you enjoy being around.Express your feelings and talk things over with someone you trust.  Counseling or talktherapy with a mental health professional may be helpful if you are having difficulty managing stress on your own. Medicine  is typically not recommended for the treatment of stress.Talk to your health care provider if you think you need medicine for symptoms of stress. Follow these instructions at home:  Keep all follow-up visits as directed by your health care provider.  Take all medicines as directed by your health care provider. Contact a health care provider if:  Your symptoms get worse or you start having new symptoms.  You feel overwhelmed by your problems and can no longer manage them on your own. Get help right away if:  You feel like hurting yourself or someone else. This information is not intended to replace advice given to you by your health care provider. Make sure you discuss any questions you have with your health care provider. Document Released: 12/28/2000 Document Revised: 12/10/2015 Document Reviewed: 02/26/2013 Elsevier Interactive Patient Education  2017 [employer].   Palpitations A palpitation is the feeling that your heartbeat is irregular or is faster than normal. It may feel like your heart is fluttering or skipping a beat. Palpitations are usually not a serious problem. They may be caused by many things, including smoking, caffeine, alcohol, stress, and certain medicines. Although most causes of palpitations are not serious, palpitations can be a sign of a serious medical problem. In some cases, you may need further medical evaluation. Follow these instructions at home: Pay attention to any changes in your symptoms. Take these actions to help with your condition:  Avoid the following: ? Caffeinated coffee, tea, soft drinks, diet pills, and energy drinks. ? Chocolate. ? Alcohol.  Do not use any tobacco products, such as cigarettes, chewing tobacco, and e-cigarettes. If you need help quitting, ask your health care provider.  Try to reduce your stress and anxiety. Things that can help you relax include: ? Yoga. ? Meditation. ? Physical activity, such as swimming, jogging, or  walking. ? Biofeedback. This is a method that helps you learn to use your mind to control things in your body, such as your heartbeats.  Get plenty of rest and sleep.  Take over-the-counter and prescription medicines only as told by your health  care provider.  Keep all follow-up visits as told by your health care provider. This is important.  Contact a health care provider if:  You continue to have a fast or irregular heartbeat after 24 hours.  Your palpitations occur more often. Get help right away if:  You have chest pain or shortness of breath.  You have a severe headache.  You feel dizzy or you faint. This information is not intended to replace advice given to you by your health care provider. Make sure you discuss any questions you have with your health care provider. Document Released: 07/01/2000 Document Revised: 12/07/2015 Document Reviewed: 03/19/2015 Elsevier Interactive Patient Education  2018 [employer].   IF you received an x-ray today, you will receive an invoice from [facility]. Please contact [facility] at [phone] with questions or concerns regarding your invoice.   IF you received labwork today, you will receive an invoice from [facility]. Please contact [facility] at [phone] with questions or concerns regarding your invoice.   Our billing staff will not be able to assist you with questions regarding bills from these companies.  You will be contacted with the lab results as soon as they are available. The fastest way to get your results is to activate your My Chart account. Instructions are located on the last page of this paperwork. If you have not heard from [place] regarding the results in 2 weeks, please contact this office.       I personally performed the services described in this documentation, which was scribed in my presence. The recorded information has been reviewed and considered for accuracy and completeness, addended by me  as needed, and agree with information above.  Signed,   Merri Ray, MD [facility] at [facility].  09/17/17 10:11 PM

## 2017-09-14 NOTE — Patient Instructions (Addendum)
OK to continue vitamin D supplement - goal of about 2000 units per day. Calcium 1200-1574m per day.  I will check into osteoporosis screening.   Continue plan from Dr. KToy Carefor anxiety symptoms, and counseling with [facility]osp[facility]. I will refer you to cardiology for the palpitations. Try ellipitical goal of few days per week - LOW intensity to start.   Recheck weight next time to see if there is a continued down trend.   I will order ultrasound of neck for follow up thyroid nodule, but then can look at collarbone next visit.   Follow up in 1 month.  Return to the clinic or go to the nearest emergency room if any of your symptoms worsen or new symptoms occur.   Stress and Stress Management Stress is a normal reaction to life events. It is what you feel when life demands more than you are used to or more than you can handle. Some stress can be useful. For example, the stress reaction can help you catch the last bus of the day, study for a test, or meet a deadline at work. But stress that occurs too often or for too long can cause problems. It can affect your emotional health and interfere with relationships and normal daily activities. Too much stress can weaken your immune system and increase your risk for physical illness. If you already have a medical problem, stress can make it worse. What are the causes? All sorts of life events may cause stress. An event that causes stress for one person may not be stressful for another person. Major life events commonly cause stress. These may be positive or negative. Examples include losing your job, moving into a new home, getting married, having a baby, or losing a loved one. Less obvious life events may also cause stress, especially if they occur day after day or in combination. Examples include working long hours, driving in traffic, caring for children, being in debt, or being in a difficult relationship. What are the signs or symptoms? Stress may cause  emotional symptoms including, the following:  Anxiety. This is feeling worried, afraid, on edge, overwhelmed, or out of control.  Anger. This is feeling irritated or impatient.  Depression. This is feeling sad, down, helpless, or guilty.  Difficulty focusing, remembering, or making decisions.  Stress may cause physical symptoms, including the following:  Aches and pains. These may affect your head, neck, back, stomach, or other areas of your body.  Tight muscles or clenched jaw.  Low energy or trouble sleeping.  Stress may cause unhealthy behaviors, including the following:  Eating to feel better (overeating) or skipping meals.  Sleeping too little, too much, or both.  Working too much or putting off tasks (procrastination).  Smoking, drinking alcohol, or using drugs to feel better.  How is this diagnosed? Stress is diagnosed through an assessment by your health care provider. Your health care provider will ask questions about your symptoms and any stressful life events.Your health care provider will also ask about your medical history and may order blood tests or other tests. Certain medical conditions and medicine can cause physical symptoms similar to stress. Mental illness can cause emotional symptoms and unhealthy behaviors similar to stress. Your health care provider may refer you to a mental health professional for further evaluation. How is this treated? Stress management is the recommended treatment for stress.The goals of stress management are reducing stressful life events and coping with stress in healthy ways. Techniques for reducing stressful life  events include the following:  Stress identification. Self-monitor for stress and identify what causes stress for you. These skills may help you to avoid some stressful events.  Time management. Set your priorities, keep a calendar of events, and learn to say "no." These tools can help you avoid making too many  commitments.  Techniques for coping with stress include the following:  Rethinking the problem. Try to think realistically about stressful events rather than ignoring them or overreacting. Try to find the positives in a stressful situation rather than focusing on the negatives.  Exercise. Physical exercise can release both physical and emotional tension. The key is to find a form of exercise you enjoy and do it regularly.  Relaxation techniques. These relax the body and mind. Examples include yoga, meditation, tai chi, biofeedback, deep breathing, progressive muscle relaxation, listening to music, being out in nature, journaling, and other hobbies. Again, the key is to find one or more that you enjoy and can do regularly.  Healthy lifestyle. Eat a balanced diet, get plenty of sleep, and do not smoke. Avoid using alcohol or drugs to relax.  Strong support network. Spend time with family, friends, or other people you enjoy being around.Express your feelings and talk things over with someone you trust.  Counseling or talktherapy with a mental health professional may be helpful if you are having difficulty managing stress on your own. Medicine is typically not recommended for the treatment of stress.Talk to your health care provider if you think you need medicine for symptoms of stress. Follow these instructions at home:  Keep all follow-up visits as directed by your health care provider.  Take all medicines as directed by your health care provider. Contact a health care provider if:  Your symptoms get worse or you start having new symptoms.  You feel overwhelmed by your problems and can no longer manage them on your own. Get help right away if:  You feel like hurting yourself or someone else. This information is not intended to replace advice given to you by your health care provider. Make sure you discuss any questions you have with your health care provider. Document Released:  12/28/2000 Document Revised: 12/10/2015 Document Reviewed: 02/26/2013 Elsevier Interactive Patient Education  2017 [employer].   Palpitations A palpitation is the feeling that your heartbeat is irregular or is faster than normal. It may feel like your heart is fluttering or skipping a beat. Palpitations are usually not a serious problem. They may be caused by many things, including smoking, caffeine, alcohol, stress, and certain medicines. Although most causes of palpitations are not serious, palpitations can be a sign of a serious medical problem. In some cases, you may need further medical evaluation. Follow these instructions at home: Pay attention to any changes in your symptoms. Take these actions to help with your condition:  Avoid the following: ? Caffeinated coffee, tea, soft drinks, diet pills, and energy drinks. ? Chocolate. ? Alcohol.  Do not use any tobacco products, such as cigarettes, chewing tobacco, and e-cigarettes. If you need help quitting, ask your health care provider.  Try to reduce your stress and anxiety. Things that can help you relax include: ? Yoga. ? Meditation. ? Physical activity, such as swimming, jogging, or walking. ? Biofeedback. This is a method that helps you learn to use your mind to control things in your body, such as your heartbeats.  Get plenty of rest and sleep.  Take over-the-counter and prescription medicines only as told by your health  care provider.  Keep all follow-up visits as told by your health care provider. This is important.  Contact a health care provider if:  You continue to have a fast or irregular heartbeat after 24 hours.  Your palpitations occur more often. Get help right away if:  You have chest pain or shortness of breath.  You have a severe headache.  You feel dizzy or you faint. This information is not intended to replace advice given to you by your health care provider. Make sure you discuss any questions you  have with your health care provider. Document Released: 07/01/2000 Document Revised: 12/07/2015 Document Reviewed: 03/19/2015 Elsevier Interactive Patient Education  2018 [employer].   IF you received an x-ray today, you will receive an invoice from [facility]. Please contact [facility] at [phone] with questions or concerns regarding your invoice.   IF you received labwork today, you will receive an invoice from [facility]. Please contact [facility] at [phone] with questions or concerns regarding your invoice.   Our billing staff will not be able to assist you with questions regarding bills from these companies.  You will be contacted with the lab results as soon as they are available. The fastest way to get your results is to activate your My Chart account. Instructions are located on the last page of this paperwork. If you have not heard from [place] regarding the results in 2 weeks, please contact this office.

## 2017-09-17 ENCOUNTER — Encounter: Payer: Self-pay | Admitting: Family Medicine

## 2017-10-05 ENCOUNTER — Ambulatory Visit
Admission: RE | Admit: 2017-10-05 | Discharge: 2017-10-05 | Disposition: A | Discharge status: Home / Self Care | Payer: 59 | Source: Ambulatory Visit | Attending: Family Medicine | Admitting: Family Medicine

## 2017-10-05 DIAGNOSIS — E041 Nontoxic single thyroid nodule: Secondary | ICD-10-CM

## 2017-10-10 ENCOUNTER — Telehealth: Payer: Self-pay | Admitting: Family Medicine

## 2017-10-10 NOTE — Telephone Encounter (Signed)
Pt advised.

## 2017-10-10 NOTE — Telephone Encounter (Signed)
Result note read to patient; verbalizes understanding.  Pt would like call back re: imaging results 10/05/17 (Thyroid)  Those results not released to [facility]. Lab results not routed to [facility].

## 2017-11-29 ENCOUNTER — Encounter (INDEPENDENT_AMBULATORY_CARE_PROVIDER_SITE_OTHER): Payer: Self-pay

## 2017-11-29 ENCOUNTER — Encounter: Payer: Self-pay | Admitting: Interventional Cardiology

## 2017-11-29 ENCOUNTER — Ambulatory Visit: Payer: 59 | Admitting: Interventional Cardiology

## 2017-11-29 DIAGNOSIS — R002 Palpitations: Secondary | ICD-10-CM

## 2017-11-29 NOTE — Progress Notes (Signed)
Cardiology Office Note    Date:  11/29/2017   ID:  Tracy Hardy, DOB 1956-01-18, MRN [id number]  PCP:  Wendie Agreste, MD  Cardiologist: Sinclair Grooms, MD   Chief Complaint  Patient presents with  . Irregular Heart Beat    History of Present Illness:  Tracy Hardy is a 62 y.o. female referred by Dr. Merri Ray for evaluation of palpitations.  The previous medical history is significant for gastroesophageal reflux, allergic rhinitis, and anxiety disorder.  Feels her heart is racing.  There when she is sitting or standing still.  She feels it in her chest and the base of the left side of her neck.  She denies dyspnea, decreased energy, chest pain, irregularity in the heartbeat.  She does not notice it so much when she lies down.  She has lost nearly 40 pounds over the last year related to the death of her mother.  She has no history of heart disease.  She denies edema.  No acute issues with thyroid disease.  She does have a thyroid nodule based on a recent ultrasound.  Her mother has a history of atrial fibrillation.  Past Medical History:  Diagnosis Date  . Allergy   . GERD (gastroesophageal reflux disease)     Past Surgical History:  Procedure Laterality Date  . CESAREAN SECTION    . TUBAL LIGATION      Current Medications: Outpatient Medications Prior to Visit  Medication Sig Dispense Refill  . ALPRAZolam (XANAX) 0.5 MG tablet Take 0.5 mg by mouth 3 (three) times daily.    . cetirizine (ZYRTEC) 10 MG tablet Take 10 mg by mouth daily.    Marland Kitchen FLUoxetine (PROZAC) 20 MG capsule Take 20 mg by mouth daily.  12  . fluticasone (FLONASE) 50 MCG/ACT nasal spray Place 2 sprays into both nostrils at bedtime.    . ALPRAZolam (XANAX) 0.5 MG tablet take ONE-HALF - 1 talet BY MOUTH at bedtime AS NEEDED (Patient not taking: Reported on 11/29/2017) 30 tablet 0  . FLUoxetine (PROZAC) 10 MG tablet Take 20 mg by mouth daily.     Marland Kitchen omeprazole (PRILOSEC) 40 MG capsule Take one  tablet as needed. (Patient not taking: No sig reported) 30 capsule 0   No facility-administered medications prior to visit.      Allergies:   Codeine   Social History   Socioeconomic History  . Marital status: Single    Spouse name: Not on file  . Number of children: Not on file  . Years of education: Not on file  . Highest education level: Not on file  Occupational History  . Not on file  Social Needs  . Financial resource strain: Not on file  . Food insecurity:    Worry: Not on file    Inability: Not on file  . Transportation needs:    Medical: Not on file    Non-medical: Not on file  Tobacco Use  . Smoking status: Former Smoker    Last attempt to quit: 04/07/1993    Years since quitting: 24.6  . Smokeless tobacco: Never Used  Substance and Sexual Activity  . Alcohol use: Yes    Alcohol/week: 0.6 oz    Types: 1 Cans of beer per week  . Drug use: No  . Sexual activity: Yes    Birth control/protection: Surgical    Comment: married, monogamous  Lifestyle  . Physical activity:    Days per week: Not on file  Minutes per session: Not on file  . Stress: Not on file  Relationships  . Social connections:    Talks on phone: Not on file    Gets together: Not on file    Attends religious service: Not on file    Active member of club or organization: Not on file    Attends meetings of clubs or organizations: Not on file    Relationship status: Not on file  Other Topics Concern  . Not on file  Social History Narrative  . Not on file     Family History:  The patient's family history includes Atrial fibrillation in her mother; Healthy in her father and sister; Hypertension in her mother; Parkinson's disease in her sister.   ROS:   Please see the history of present illness.    Stress but otherwise no complaints.  Stress is improving. All other systems reviewed and are negative.   PHYSICAL EXAM:   VS:  BP 114/82   Pulse 69   Ht 5\' 5"  (1.651 m)   Wt 144 lb 6.4 oz  (65.5 kg)   BMI 24.03 kg/m    GEN: Well nourished, well developed, in no acute distress  HEENT: normal  Neck: no JVD, carotid bruits, or masses Cardiac: RRR; no murmurs, rubs, or gallops,no edema  Respiratory:  clear to auscultation bilaterally, normal work of breathing GI: soft, nontender, nondistended, + BS MS: no deformity or atrophy  Skin: warm and dry, no rash Neuro:  Alert and Oriented x 3, Strength and sensation are intact Psych: euthymic mood, full affect  Wt Readings from Last 3 Encounters:  11/29/17 144 lb 6.4 oz (65.5 kg)  09/14/17 147 lb (66.7 kg)  09/01/17 152 lb 3.2 oz (69 kg)      Studies/Labs Reviewed:   EKG:  EKG sinus bradycardia with poor R wave progression.  Recent Labs: 09/01/2017: ALT 15; BUN 12; Creatinine, Ser 0.94; Hemoglobin 13.8; Magnesium 2.7; Platelets 233; Potassium 4.0; Sodium 140; TSH 1.490   Lipid Panel    Component Value Date/Time   CHOL 206 (H) 09/01/2017 1201   TRIG 101 09/01/2017 1201   HDL 52 09/01/2017 1201   CHOLHDL 4.0 09/01/2017 1201   CHOLHDL 3.9 04/07/2016 0841   VLDL 16 04/07/2016 0841   LDLCALC 134 (H) 09/01/2017 1201    Additional studies/ records that were reviewed today include:  None    ASSESSMENT:    1. Palpitations      PLAN:  In order of problems listed above:  1. 48-hour Holter monitor to rule out alteration in rhythm.  Clinical exam is totally normal.  If Holter is unremarkable no further cardiac evaluation unless continued or additional complaints.    Medication Adjustments/Labs and Tests Ordered: Current medicines are reviewed at length with the patient today.  Concerns regarding medicines are outlined above.  Medication changes, Labs and Tests ordered today are listed in the Patient Instructions below. Patient Instructions  Medication Instructions:  Your physician recommends that you continue on your current medications as directed. Please refer to the Current Medication list given to you  today.  Labwork: None  Testing/Procedures: Your physician has recommended that you wear a 48 hour holter monitor. Holter monitors are medical devices that record the heart's electrical activity. Doctors most often use these monitors to diagnose arrhythmias. Arrhythmias are problems with the speed or rhythm of the heartbeat. The monitor is a small, portable device. You can wear one while you do your normal daily activities. This is usually  used to diagnose what is causing palpitations/syncope (passing out).   Follow-Up: Your physician recommends that you schedule a follow-up appointment as needed with Dr. Tamala Julian.   Any Other Special Instructions Will Be Listed Below (If Applicable).     If you need a refill on your cardiac medications before your next appointment, please call your pharmacy.      Signed, Sinclair Grooms, MD  11/29/2017 3:10 PM    [facility] [address], [place], [place]  [address] Phone: [phone]; Fax: [phone]

## 2017-11-29 NOTE — Patient Instructions (Signed)
Medication Instructions:  Your physician recommends that you continue on your current medications as directed. Please refer to the Current Medication list given to you today.  Labwork: None  Testing/Procedures: Your physician has recommended that you wear a 48 hour holter monitor. Holter monitors are medical devices that record the heart's electrical activity. Doctors most often use these monitors to diagnose arrhythmias. Arrhythmias are problems with the speed or rhythm of the heartbeat. The monitor is a small, portable device. You can wear one while you do your normal daily activities. This is usually used to diagnose what is causing palpitations/syncope (passing out).    Follow-Up: Your physician recommends that you schedule a follow-up appointment as needed with Dr. Smith.   Any Other Special Instructions Will Be Listed Below (If Applicable).     If you need a refill on your cardiac medications before your next appointment, please call your pharmacy.   

## 2017-12-05 DIAGNOSIS — S0501XA Injury of conjunctiva and corneal abrasion without foreign body, right eye, initial encounter: Secondary | ICD-10-CM | POA: Diagnosis not present

## 2017-12-06 DIAGNOSIS — J0140 Acute pansinusitis, unspecified: Secondary | ICD-10-CM | POA: Diagnosis not present

## 2017-12-12 ENCOUNTER — Ambulatory Visit (INDEPENDENT_AMBULATORY_CARE_PROVIDER_SITE_OTHER): Payer: 59

## 2017-12-12 DIAGNOSIS — R002 Palpitations: Secondary | ICD-10-CM | POA: Diagnosis not present

## 2017-12-26 ENCOUNTER — Telehealth: Payer: Self-pay | Admitting: Interventional Cardiology

## 2017-12-26 NOTE — Telephone Encounter (Signed)
Follow Up: ° ° ° °Returning your call from yesterday. °

## 2018-01-01 NOTE — Telephone Encounter (Signed)
Follow up    Patient calling for monitor results- Please call [phone]

## 2018-01-01 NOTE — Telephone Encounter (Signed)
Informed pt of results. Pt verbalized understanding. 

## 2018-01-20 ENCOUNTER — Encounter: Payer: Self-pay | Admitting: Physician Assistant

## 2018-01-20 ENCOUNTER — Other Ambulatory Visit: Payer: Self-pay

## 2018-01-20 ENCOUNTER — Ambulatory Visit (INDEPENDENT_AMBULATORY_CARE_PROVIDER_SITE_OTHER): Payer: 59 | Admitting: Physician Assistant

## 2018-01-20 VITALS — BP 130/76 | HR 58 | Temp 98.1°F | Resp 18 | Ht 65.0 in | Wt 145.2 lb

## 2018-01-20 DIAGNOSIS — J3489 Other specified disorders of nose and nasal sinuses: Secondary | ICD-10-CM | POA: Diagnosis not present

## 2018-01-20 MED ORDER — AZITHROMYCIN 250 MG PO TABS: ORAL_TABLET | ORAL | 0 refills | Status: AC

## 2018-01-20 NOTE — Patient Instructions (Addendum)
  Continue the home therapy. Start the zpack.  Okay to add in tylenol 1000 mg every 8 as needed.    IF you received an x-ray today, you will receive an invoice from [facility]. Please contact [facility] at [phone] with questions or concerns regarding your invoice.   IF you received labwork today, you will receive an invoice from [facility]. Please contact [facility] at [phone] with questions or concerns regarding your invoice.   Our billing staff will not be able to assist you with questions regarding bills from these companies.  You will be contacted with the lab results as soon as they are available. The fastest way to get your results is to activate your My Chart account. Instructions are located on the last page of this paperwork. If you have not heard from [place] regarding the results in 2 weeks, please contact this office.

## 2018-01-20 NOTE — Progress Notes (Signed)
01/22/2018 8:09 AM   DOB: 23-Feb-1956 / MRN: [id number]  SUBJECTIVE:  Tracy Hardy is a 62 y.o. female presenting for cough, nasal congestion, sore throat.  Denies fever.  Symptoms present now for 10 days worsening.  She is eating well. Taking OTC analgesics with some relief. Denies chest pain, SOB, new DOE, orthopnea, leg swelling.   She is allergic to codeine.   She  has a past medical history of Allergy and GERD (gastroesophageal reflux disease).    She  reports that she quit smoking about 24 years ago. She has never used smokeless tobacco. She reports that she drinks about 0.6 oz of alcohol per week. She reports that she does not use drugs. She  reports that she currently engages in sexual activity. She reports using the following method of birth control/protection: Surgical. The patient  has a past surgical history that includes Cesarean section and Tubal ligation.  Her family history includes Atrial fibrillation in her mother; Healthy in her father and sister; Hypertension in her mother; Parkinson's disease in her sister.  ROS per HPI  The problem list and medications were reviewed and updated by myself where necessary and exist elsewhere in the encounter.   OBJECTIVE:  BP 130/76   Pulse (!) 58   Temp 98.1 F (36.7 C) (Oral)   Resp 18   Ht 5\' 5"  (1.651 m)   Wt 145 lb 3.2 oz (65.9 kg)   SpO2 98%   BMI 24.16 kg/m   Wt Readings from Last 3 Encounters:  01/20/18 145 lb 3.2 oz (65.9 kg)  11/29/17 144 lb 6.4 oz (65.5 kg)  09/14/17 147 lb (66.7 kg)   Temp Readings from Last 3 Encounters:  01/20/18 98.1 F (36.7 C) (Oral)  09/14/17 98 F (36.7 C) (Oral)  09/01/17 (!) 97.3 F (36.3 C) (Oral)   BP Readings from Last 3 Encounters:  01/20/18 130/76  11/29/17 114/82  09/14/17 122/78   Pulse Readings from Last 3 Encounters:  01/20/18 (!) 58  11/29/17 69  09/14/17 89    Physical Exam  Constitutional: She is oriented to person, place, and time. She appears  well-nourished. No distress.  HENT:  Head:    Right Ear: External ear normal.  Left Ear: External ear normal.  Nose: Mucosal edema present. Right sinus exhibits no maxillary sinus tenderness and no frontal sinus tenderness. Left sinus exhibits no maxillary sinus tenderness and no frontal sinus tenderness.  Mouth/Throat: Oropharynx is clear and moist. No oropharyngeal exudate.  Eyes: Pupils are equal, round, and reactive to light. Conjunctivae and EOM are normal.  Cardiovascular: Normal rate, regular rhythm and normal heart sounds.  Pulmonary/Chest: Effort normal and breath sounds normal. She has no rales.  Abdominal: She exhibits no distension.  Neurological: She is alert and oriented to person, place, and time. No cranial nerve deficit. Gait normal.  Skin: Skin is warm and dry. No rash noted. She is not diaphoretic. No erythema.  Psychiatric: She has a normal mood and affect. Her behavior is normal.  Vitals reviewed.   No results found for: HGBA1C  Lab Results  Component Value Date   WBC 5.1 09/01/2017   HGB 13.8 09/01/2017   HCT 41.4 09/01/2017   MCV 95 09/01/2017   PLT 233 09/01/2017    Lab Results  Component Value Date   CREATININE 0.94 09/01/2017   BUN 12 09/01/2017   NA 140 09/01/2017   K 4.0 09/01/2017   CL 102 09/01/2017   CO2 22 09/01/2017  Lab Results  Component Value Date   ALT 15 09/01/2017   AST 15 09/01/2017   ALKPHOS 57 09/01/2017   BILITOT 0.3 09/01/2017    Lab Results  Component Value Date   TSH 1.490 09/01/2017    Lab Results  Component Value Date   CHOL 206 (H) 09/01/2017   HDL 52 09/01/2017   LDLCALC 134 (H) 09/01/2017   TRIG 101 09/01/2017   CHOLHDL 4.0 09/01/2017     ASSESSMENT AND PLAN:  Tracy Hardy was seen today for sinus problem.  Diagnoses and all orders for this visit:  Tenderness over maxillary sinus -     azithromycin (ZITHROMAX) 250 MG tablet; Take two tabs on day on and one daily thereafter.    The patient is  advised to call or return to clinic if she does not see an improvement in symptoms, or to seek the care of the closest emergency department if she worsens with the above plan.   Philis Fendt, MHS, PA-C [facility] 01/22/2018 8:09 AM

## 2018-04-10 DIAGNOSIS — L821 Other seborrheic keratosis: Secondary | ICD-10-CM | POA: Diagnosis not present

## 2018-04-10 DIAGNOSIS — D225 Melanocytic nevi of trunk: Secondary | ICD-10-CM | POA: Diagnosis not present

## 2018-04-10 DIAGNOSIS — X32XXXD Exposure to sunlight, subsequent encounter: Secondary | ICD-10-CM | POA: Diagnosis not present

## 2018-04-10 DIAGNOSIS — L570 Actinic keratosis: Secondary | ICD-10-CM | POA: Diagnosis not present

## 2018-05-11 DIAGNOSIS — L218 Other seborrheic dermatitis: Secondary | ICD-10-CM | POA: Diagnosis not present

## 2018-05-11 DIAGNOSIS — L820 Inflamed seborrheic keratosis: Secondary | ICD-10-CM | POA: Diagnosis not present

## 2018-10-07 DIAGNOSIS — J309 Allergic rhinitis, unspecified: Secondary | ICD-10-CM | POA: Diagnosis not present

## 2020-02-05 ENCOUNTER — Other Ambulatory Visit: Payer: Self-pay | Admitting: Family Medicine

## 2020-02-05 DIAGNOSIS — Z1231 Encounter for screening mammogram for malignant neoplasm of breast: Secondary | ICD-10-CM

## 2020-02-13 ENCOUNTER — Other Ambulatory Visit: Payer: Self-pay

## 2020-02-13 ENCOUNTER — Ambulatory Visit
Admission: RE | Admit: 2020-02-13 | Discharge: 2020-02-13 | Disposition: A | Discharge status: Home / Self Care | Payer: 59 | Source: Ambulatory Visit | Attending: Family Medicine | Admitting: Family Medicine

## 2020-02-13 DIAGNOSIS — Z1231 Encounter for screening mammogram for malignant neoplasm of breast: Secondary | ICD-10-CM

## 2021-02-26 ENCOUNTER — Other Ambulatory Visit: Payer: Self-pay | Admitting: Family Medicine

## 2021-02-26 DIAGNOSIS — Z1231 Encounter for screening mammogram for malignant neoplasm of breast: Secondary | ICD-10-CM

## 2021-03-04 ENCOUNTER — Ambulatory Visit
Admission: RE | Admit: 2021-03-04 | Discharge: 2021-03-04 | Disposition: A | Discharge status: Home / Self Care | Payer: 59 | Source: Ambulatory Visit | Attending: Family Medicine | Admitting: Family Medicine

## 2021-03-04 ENCOUNTER — Other Ambulatory Visit: Payer: Self-pay

## 2021-03-04 DIAGNOSIS — Z1231 Encounter for screening mammogram for malignant neoplasm of breast: Secondary | ICD-10-CM

## 2021-07-27 DIAGNOSIS — M542 Cervicalgia: Secondary | ICD-10-CM | POA: Diagnosis not present

## 2021-08-30 DIAGNOSIS — E559 Vitamin D deficiency, unspecified: Secondary | ICD-10-CM | POA: Diagnosis not present

## 2021-08-30 DIAGNOSIS — E041 Nontoxic single thyroid nodule: Secondary | ICD-10-CM | POA: Diagnosis not present

## 2021-08-30 DIAGNOSIS — Z79899 Other long term (current) drug therapy: Secondary | ICD-10-CM | POA: Diagnosis not present

## 2021-08-30 DIAGNOSIS — M8589 Other specified disorders of bone density and structure, multiple sites: Secondary | ICD-10-CM | POA: Diagnosis not present

## 2021-08-30 DIAGNOSIS — Z0000 Encounter for general adult medical examination without abnormal findings: Secondary | ICD-10-CM | POA: Diagnosis not present

## 2021-08-30 DIAGNOSIS — R7309 Other abnormal glucose: Secondary | ICD-10-CM | POA: Diagnosis not present

## 2021-09-06 DIAGNOSIS — R7309 Other abnormal glucose: Secondary | ICD-10-CM | POA: Diagnosis not present

## 2021-09-06 DIAGNOSIS — E041 Nontoxic single thyroid nodule: Secondary | ICD-10-CM | POA: Diagnosis not present

## 2021-09-06 DIAGNOSIS — Z0000 Encounter for general adult medical examination without abnormal findings: Secondary | ICD-10-CM | POA: Diagnosis not present

## 2021-09-06 DIAGNOSIS — M8580 Other specified disorders of bone density and structure, unspecified site: Secondary | ICD-10-CM | POA: Diagnosis not present

## 2021-09-06 DIAGNOSIS — Z9109 Other allergy status, other than to drugs and biological substances: Secondary | ICD-10-CM | POA: Diagnosis not present

## 2021-09-06 DIAGNOSIS — Z23 Encounter for immunization: Secondary | ICD-10-CM | POA: Diagnosis not present

## 2021-09-06 DIAGNOSIS — E559 Vitamin D deficiency, unspecified: Secondary | ICD-10-CM | POA: Diagnosis not present

## 2022-01-12 DIAGNOSIS — M65341 Trigger finger, right ring finger: Secondary | ICD-10-CM | POA: Diagnosis not present

## 2022-01-12 DIAGNOSIS — M79641 Pain in right hand: Secondary | ICD-10-CM | POA: Diagnosis not present

## 2022-05-25 DIAGNOSIS — C44612 Basal cell carcinoma of skin of right upper limb, including shoulder: Secondary | ICD-10-CM | POA: Diagnosis not present

## 2022-05-25 DIAGNOSIS — L570 Actinic keratosis: Secondary | ICD-10-CM | POA: Diagnosis not present

## 2022-05-25 DIAGNOSIS — X32XXXD Exposure to sunlight, subsequent encounter: Secondary | ICD-10-CM | POA: Diagnosis not present

## 2022-07-27 DIAGNOSIS — L570 Actinic keratosis: Secondary | ICD-10-CM | POA: Diagnosis not present

## 2022-07-27 DIAGNOSIS — Z08 Encounter for follow-up examination after completed treatment for malignant neoplasm: Secondary | ICD-10-CM | POA: Diagnosis not present

## 2022-07-27 DIAGNOSIS — Z85828 Personal history of other malignant neoplasm of skin: Secondary | ICD-10-CM | POA: Diagnosis not present

## 2022-07-27 DIAGNOSIS — X32XXXD Exposure to sunlight, subsequent encounter: Secondary | ICD-10-CM | POA: Diagnosis not present

## 2022-07-27 DIAGNOSIS — C44319 Basal cell carcinoma of skin of other parts of face: Secondary | ICD-10-CM | POA: Diagnosis not present

## 2022-08-20 ENCOUNTER — Ambulatory Visit (INDEPENDENT_AMBULATORY_CARE_PROVIDER_SITE_OTHER): Payer: PPO

## 2022-08-20 ENCOUNTER — Ambulatory Visit (HOSPITAL_COMMUNITY)
Admission: EM | Admit: 2022-08-20 | Discharge: 2022-08-20 | Disposition: A | Discharge status: Home / Self Care | Payer: PPO

## 2022-08-20 ENCOUNTER — Encounter (HOSPITAL_COMMUNITY): Payer: Self-pay | Admitting: Emergency Medicine

## 2022-08-20 DIAGNOSIS — R059 Cough, unspecified: Secondary | ICD-10-CM | POA: Diagnosis not present

## 2022-08-20 DIAGNOSIS — R051 Acute cough: Secondary | ICD-10-CM

## 2022-08-20 DIAGNOSIS — J019 Acute sinusitis, unspecified: Secondary | ICD-10-CM | POA: Diagnosis not present

## 2022-08-20 DIAGNOSIS — B9689 Other specified bacterial agents as the cause of diseases classified elsewhere: Secondary | ICD-10-CM

## 2022-08-20 MED ORDER — AMOXICILLIN-POT CLAVULANATE 875-125 MG PO TABS: 1.0000 | ORAL_TABLET | Freq: Two times a day (BID) | ORAL | 0 refills | Status: DC

## 2022-08-20 MED ORDER — DEXAMETHASONE SODIUM PHOSPHATE 10 MG/ML IJ SOLN
INTRAMUSCULAR | Status: AC
  Filled 2022-08-20: qty 1

## 2022-08-20 MED ORDER — DEXAMETHASONE SODIUM PHOSPHATE 10 MG/ML IJ SOLN
10.0000 mg | Freq: Once | INTRAMUSCULAR | Status: AC
  Administered 2022-08-20: 10 mg via INTRAMUSCULAR

## 2022-08-20 MED ORDER — BENZONATATE 100 MG PO CAPS: 100.0000 mg | ORAL_CAPSULE | Freq: Three times a day (TID) | ORAL | 0 refills | Status: DC

## 2022-08-20 MED ORDER — PROMETHAZINE-DM 6.25-15 MG/5ML PO SYRP: 5.0000 mL | ORAL_SOLUTION | Freq: Every evening | ORAL | 0 refills | Status: DC | PRN

## 2022-08-20 NOTE — Discharge Instructions (Signed)
Your evaluation shows you have a bacterial sinus infection plus a viral infection of the upper airways of your lungs. Use the following medicines to help with your symptoms:  - Take antibiotic sent to pharmacy as directed to treat sinus infection. - Tessalon perles every 8 hours as needed for cough. - Take Promethazine DM cough medication to help with your cough at nighttime so that you are able to sleep. Do not drive, drink alcohol, or go to work while taking this medication since it can make you sleepy. Only take this at nighttime.  - Purchase Mucinex over the counter and take this every 12 hours as needed for nasal congestion and cough.  If you develop any new or worsening symptoms or do not improve in the next 2 to 3 days, please return.  If your symptoms are severe, please go to the emergency room.  Follow-up with your primary care provider for further evaluation and management of your symptoms as well as ongoing wellness visits.  I hope you feel better!

## 2022-08-20 NOTE — ED Triage Notes (Signed)
Productive cough, nasal congestion, lack of appetite x 2 weeks. Taking Flonase, Mucinex, and an Ipatroprium nasal spray. 67 year old grandchild lives with her and she's exposed to numerous things

## 2022-08-20 NOTE — ED Provider Notes (Signed)
[place]    CSN: [id number] Arrival date & time: 08/20/22  1447      History   Chief Complaint Chief Complaint  Patient presents with   Cough    HPI Tracy Hardy is a 67 y.o. female.   Patient presents to urgent care for evaluation of cough, nasal congestion, sore throat, and generalized fatigue for the last 2 weeks.  Cough is productive with clear/yellow phlegm, sounds wet, and is worse at nighttime.  Nasal congestion is persistent despite use of over-the-counter medications.  Patient has been using guaifenesin extra strength extended release during the daytime, Flonase, ipratropium nasal spray, and Zyrtec at bedtime to help with symptoms.  She has also been taking vitamins over-the-counter and attempt to improve symptoms without relief.  She is a former cigarette smoker, quit 30 years ago.  No other drug use reported.  Denies history of chronic respiratory problems or cardiopulmonary problems.  She denies nausea, vomiting, abdominal pain, dizziness, extremity weakness, headache, vision changes, fever/chills, shortness of breath, chest pain, and heart palpitations. Denies recent antibiotic or steroid use.    Cough   Past Medical History:  Diagnosis Date   Allergy    GERD (gastroesophageal reflux disease)     Patient Active Problem List   Diagnosis Date Noted   Palpitations 11/29/2017   Allergic rhinitis 04/07/2016   Anxiety 04/07/2016   GERD (gastroesophageal reflux disease) 04/07/2016    Past Surgical History:  Procedure Laterality Date   CESAREAN SECTION     JOINT REPLACEMENT     hip replacement   TUBAL LIGATION      OB History   No obstetric history on file.      Home Medications    Prior to Admission medications   Medication Sig Start Date End Date Taking? Authorizing Provider  ALPRAZolam Duanne Moron) 0.5 MG tablet Take 0.5 mg by mouth 3 (three) times daily.   Yes [provider]  amoxicillin-clavulanate (AUGMENTIN) 875-125 MG  tablet Take 1 tablet by mouth every 12 (twelve) hours. 08/20/22  Yes Talbot Grumbling, FNP  benzonatate (TESSALON) 100 MG capsule Take 1 capsule (100 mg total) by mouth every 8 (eight) hours. 08/20/22  Yes Talbot Grumbling, FNP  cetirizine (ZYRTEC) 10 MG tablet Take 10 mg by mouth daily.   Yes [provider]  Cholecalciferol 10 MCG (400 UNIT) CAPS Vitamin D3 10 mcg (400 unit) capsule   Yes [provider]  FLUoxetine (PROZAC) 20 MG capsule Take 20 mg by mouth daily. 11/12/17  Yes [provider]  fluticasone (FLONASE) 50 MCG/ACT nasal spray Place 2 sprays into both nostrils at bedtime.   Yes [provider]  promethazine-dextromethorphan (PROMETHAZINE-DM) 6.25-15 MG/5ML syrup Take 5 mLs by mouth at bedtime as needed for cough. 08/20/22  Yes Rollen Selders, Stasia Cavalier, FNP    Family History Family History  Problem Relation Age of Onset   Hypertension Mother    Atrial fibrillation Mother    Healthy Father    Parkinson's disease Sister    Healthy Sister     Social History Social History   Tobacco Use   Smoking status: Former    Types: Cigarettes    Quit date: 04/07/1993    Years since quitting: 29.3   Smokeless tobacco: Never  Substance Use Topics   Alcohol use: Yes    Alcohol/week: 1.0 standard drink of alcohol    Types: 1 Cans of beer per week   Drug use: No     Allergies  Codeine   Review of Systems Review of Systems  Respiratory:  Positive for cough.   Per HPI   Physical Exam Triage Vital Signs ED Triage Vitals  Enc Vitals Group     BP 08/20/22 1638 (!) 170/94     Pulse Rate 08/20/22 1638 84     Resp 08/20/22 1638 16     Temp 08/20/22 1638 97.8 F (36.6 C)     Temp Source 08/20/22 1638 Oral     SpO2 08/20/22 1638 96 %     Weight --      Height --      Head Circumference --      Peak Flow --      Pain Score 08/20/22 1642 3     Pain Loc --      Pain Edu? --      Excl. in [place]? --    No data found.  Updated Vital  Signs BP (!) 170/94 (BP Location: Right Arm)   Pulse 84   Temp 97.8 F (36.6 C) (Oral)   Resp 16   SpO2 96%   Visual Acuity Right Eye Distance:   Left Eye Distance:   Bilateral Distance:    Right Eye Near:   Left Eye Near:    Bilateral Near:     Physical Exam Vitals and nursing note reviewed.  Constitutional:      Appearance: Normal appearance. She is not ill-appearing or toxic-appearing.  HENT:     Head: Normocephalic and atraumatic.     Jaw: There is normal jaw occlusion.     Right Ear: Hearing, tympanic membrane, ear canal and external ear normal.     Left Ear: Hearing, tympanic membrane, ear canal and external ear normal.     Nose: Congestion present. No rhinorrhea.     Right Turbinates: Swollen.     Left Turbinates: Swollen.     Right Sinus: Maxillary sinus tenderness present.     Left Sinus: Maxillary sinus tenderness present.     Mouth/Throat:     Lips: Pink.     Mouth: Mucous membranes are moist.     Dentition: Normal dentition.     Tongue: No lesions.     Palate: No mass and lesions.     Pharynx: Uvula midline. No posterior oropharyngeal erythema.     Tonsils: No tonsillar exudate or tonsillar abscesses.  Eyes:     General: Lids are normal. Vision grossly intact. Gaze aligned appropriately.     Extraocular Movements: Extraocular movements intact.     Conjunctiva/sclera: Conjunctivae normal.  Cardiovascular:     Rate and Rhythm: Normal rate and regular rhythm.     Heart sounds: Normal heart sounds, S1 normal and S2 normal.  Pulmonary:     Effort: Pulmonary effort is normal. No respiratory distress.     Breath sounds: Normal breath sounds and air entry. No wheezing, rhonchi or rales.  Chest:     Chest wall: No tenderness.  Musculoskeletal:     Cervical back: Neck supple.  Lymphadenopathy:     Cervical: Cervical adenopathy present.  Skin:    General: Skin is warm and dry.     Capillary Refill: Capillary refill takes less than 2 seconds.     Findings: No  rash.  Neurological:     General: No focal deficit present.     Mental Status: She is alert and oriented to person, place, and time. Mental status is at baseline.     Cranial Nerves: No dysarthria or  facial asymmetry.  Psychiatric:        Mood and Affect: Mood normal.        Speech: Speech normal.        Behavior: Behavior normal.        Thought Content: Thought content normal.        Judgment: Judgment normal.      UC Treatments / Results  Labs (all labs ordered are listed, but only abnormal results are displayed) Labs Reviewed - No data to display  EKG   Radiology DG Chest 2 View  Result Date: 08/20/2022 CLINICAL DATA:  Cough for 2 weeks. EXAM: CHEST - 2 VIEW COMPARISON:  09/23/2016 FINDINGS: The heart size and mediastinal contours are within normal limits. Both lungs are clear. The visualized skeletal structures are unremarkable. IMPRESSION: No active cardiopulmonary disease. Electronically Signed   By: Lucienne Capers M.D.   On: 08/20/2022 17:20    Procedures Procedures (including critical care time)  Medications Ordered in UC Medications  dexamethasone (DECADRON) injection 10 mg (10 mg Intramuscular Given 08/20/22 1738)    Initial Impression / Assessment and Plan / UC Course  I have reviewed the triage vital signs and the nursing notes.  Pertinent labs & imaging results that were available during my care of the patient were reviewed by me and considered in my medical decision making (see chart for details).   1. Acute cough, acute bacterial sinusitis Presentation consistent with post-viral bacterial sinus infection and acute cough likely due to recent viral upper respiratory infection. Chest x-ray is negative for cardiopulmonary abnormality.  Patient given decadron IM in clinic for cough. Symptoms have been present for greater than 7 days, therefore will treat with antibiotic to be taken as prescribed. Saline nasal spray and OTC mucinex recommended. No NSAIDs until  tomorrow due to steroid injection in clinic to reduce inflammation to the lungs contributing to cough. Promethazine DM may be used at bedtime as needed for cough, drowsiness precautions dicussed. No indication for viral testing due to timing of symptoms/illness.   Discussed physical exam and available lab work findings in clinic with patient.  Counseled patient regarding appropriate use of medications and potential side effects for all medications recommended or prescribed today. Discussed red flag signs and symptoms of worsening condition,when to call the PCP office, return to urgent care, and when to seek higher level of care in the emergency department. Patient verbalizes understanding and agreement with plan. All questions answered. Patient discharged in stable condition.    Final Clinical Impressions(s) / UC Diagnoses   Final diagnoses:  Acute cough  Acute bacterial sinusitis     Discharge Instructions      Your evaluation shows you have a bacterial sinus infection plus a viral infection of the upper airways of your lungs. Use the following medicines to help with your symptoms:  - Take antibiotic sent to pharmacy as directed to treat sinus infection. - Tessalon perles every 8 hours as needed for cough. - Take Promethazine DM cough medication to help with your cough at nighttime so that you are able to sleep. Do not drive, drink alcohol, or go to work while taking this medication since it can make you sleepy. Only take this at nighttime.  - Purchase Mucinex over the counter and take this every 12 hours as needed for nasal congestion and cough.  If you develop any new or worsening symptoms or do not improve in the next 2 to 3 days, please return.  If your symptoms are  severe, please go to the emergency room.  Follow-up with your primary care provider for further evaluation and management of your symptoms as well as ongoing wellness visits.  I hope you feel better!    ED Prescriptions      Medication Sig Dispense Auth. Provider   amoxicillin-clavulanate (AUGMENTIN) 875-125 MG tablet Take 1 tablet by mouth every 12 (twelve) hours. 14 tablet Talbot Grumbling, FNP   promethazine-dextromethorphan (PROMETHAZINE-DM) 6.25-15 MG/5ML syrup Take 5 mLs by mouth at bedtime as needed for cough. 118 mL Joella Prince M, FNP   benzonatate (TESSALON) 100 MG capsule Take 1 capsule (100 mg total) by mouth every 8 (eight) hours. 21 capsule Talbot Grumbling, FNP      PDMP not reviewed this encounter.   Talbot Grumbling, [place] 08/20/22 2136

## 2022-09-01 DIAGNOSIS — E041 Nontoxic single thyroid nodule: Secondary | ICD-10-CM | POA: Diagnosis not present

## 2022-09-01 DIAGNOSIS — Z0000 Encounter for general adult medical examination without abnormal findings: Secondary | ICD-10-CM | POA: Diagnosis not present

## 2022-09-01 DIAGNOSIS — E559 Vitamin D deficiency, unspecified: Secondary | ICD-10-CM | POA: Diagnosis not present

## 2022-09-01 DIAGNOSIS — R7309 Other abnormal glucose: Secondary | ICD-10-CM | POA: Diagnosis not present

## 2022-09-08 DIAGNOSIS — Z13820 Encounter for screening for osteoporosis: Secondary | ICD-10-CM | POA: Diagnosis not present

## 2022-09-08 DIAGNOSIS — Z79899 Other long term (current) drug therapy: Secondary | ICD-10-CM | POA: Diagnosis not present

## 2022-09-08 DIAGNOSIS — R6889 Other general symptoms and signs: Secondary | ICD-10-CM | POA: Diagnosis not present

## 2022-09-08 DIAGNOSIS — E559 Vitamin D deficiency, unspecified: Secondary | ICD-10-CM | POA: Diagnosis not present

## 2022-09-08 DIAGNOSIS — Z13220 Encounter for screening for lipoid disorders: Secondary | ICD-10-CM | POA: Diagnosis not present

## 2022-09-08 DIAGNOSIS — Z0000 Encounter for general adult medical examination without abnormal findings: Secondary | ICD-10-CM | POA: Diagnosis not present

## 2022-09-08 DIAGNOSIS — Z639 Problem related to primary support group, unspecified: Secondary | ICD-10-CM | POA: Diagnosis not present

## 2022-09-08 DIAGNOSIS — J329 Chronic sinusitis, unspecified: Secondary | ICD-10-CM | POA: Diagnosis not present

## 2022-09-08 DIAGNOSIS — E041 Nontoxic single thyroid nodule: Secondary | ICD-10-CM | POA: Diagnosis not present

## 2022-09-08 DIAGNOSIS — Z9109 Other allergy status, other than to drugs and biological substances: Secondary | ICD-10-CM | POA: Diagnosis not present

## 2022-09-08 DIAGNOSIS — R7309 Other abnormal glucose: Secondary | ICD-10-CM | POA: Diagnosis not present

## 2022-09-08 DIAGNOSIS — F419 Anxiety disorder, unspecified: Secondary | ICD-10-CM | POA: Diagnosis not present

## 2022-09-09 ENCOUNTER — Other Ambulatory Visit: Payer: Self-pay | Admitting: Family Medicine

## 2022-09-09 DIAGNOSIS — Z1231 Encounter for screening mammogram for malignant neoplasm of breast: Secondary | ICD-10-CM

## 2022-10-18 DIAGNOSIS — J342 Deviated nasal septum: Secondary | ICD-10-CM | POA: Diagnosis not present

## 2022-10-18 DIAGNOSIS — J324 Chronic pansinusitis: Secondary | ICD-10-CM | POA: Diagnosis not present

## 2022-10-18 DIAGNOSIS — J343 Hypertrophy of nasal turbinates: Secondary | ICD-10-CM | POA: Diagnosis not present

## 2022-10-20 ENCOUNTER — Ambulatory Visit
Admission: RE | Admit: 2022-10-20 | Discharge: 2022-10-20 | Disposition: A | Discharge status: Home / Self Care | Payer: PPO | Source: Ambulatory Visit | Attending: Family Medicine | Admitting: Family Medicine

## 2022-10-20 ENCOUNTER — Other Ambulatory Visit: Payer: Self-pay | Admitting: Otolaryngology

## 2022-10-20 DIAGNOSIS — Z1231 Encounter for screening mammogram for malignant neoplasm of breast: Secondary | ICD-10-CM | POA: Diagnosis not present

## 2022-10-20 DIAGNOSIS — J329 Chronic sinusitis, unspecified: Secondary | ICD-10-CM

## 2022-11-23 DIAGNOSIS — L570 Actinic keratosis: Secondary | ICD-10-CM | POA: Diagnosis not present

## 2022-11-23 DIAGNOSIS — L820 Inflamed seborrheic keratosis: Secondary | ICD-10-CM | POA: Diagnosis not present

## 2022-11-23 DIAGNOSIS — Z08 Encounter for follow-up examination after completed treatment for malignant neoplasm: Secondary | ICD-10-CM | POA: Diagnosis not present

## 2022-11-23 DIAGNOSIS — Z85828 Personal history of other malignant neoplasm of skin: Secondary | ICD-10-CM | POA: Diagnosis not present

## 2022-11-23 DIAGNOSIS — X32XXXD Exposure to sunlight, subsequent encounter: Secondary | ICD-10-CM | POA: Diagnosis not present

## 2023-02-14 DIAGNOSIS — K219 Gastro-esophageal reflux disease without esophagitis: Secondary | ICD-10-CM | POA: Diagnosis not present

## 2023-02-14 DIAGNOSIS — J329 Chronic sinusitis, unspecified: Secondary | ICD-10-CM | POA: Diagnosis not present

## 2023-02-14 DIAGNOSIS — J309 Allergic rhinitis, unspecified: Secondary | ICD-10-CM | POA: Diagnosis not present

## 2023-02-24 DIAGNOSIS — J342 Deviated nasal septum: Secondary | ICD-10-CM | POA: Diagnosis not present

## 2023-02-24 DIAGNOSIS — J329 Chronic sinusitis, unspecified: Secondary | ICD-10-CM | POA: Diagnosis not present

## 2023-03-07 DIAGNOSIS — J329 Chronic sinusitis, unspecified: Secondary | ICD-10-CM | POA: Diagnosis not present

## 2023-03-07 DIAGNOSIS — J309 Allergic rhinitis, unspecified: Secondary | ICD-10-CM | POA: Diagnosis not present

## 2023-03-07 DIAGNOSIS — K219 Gastro-esophageal reflux disease without esophagitis: Secondary | ICD-10-CM | POA: Diagnosis not present

## 2023-03-07 DIAGNOSIS — H6121 Impacted cerumen, right ear: Secondary | ICD-10-CM | POA: Diagnosis not present

## 2023-04-21 DIAGNOSIS — L570 Actinic keratosis: Secondary | ICD-10-CM | POA: Diagnosis not present

## 2023-04-21 DIAGNOSIS — X32XXXD Exposure to sunlight, subsequent encounter: Secondary | ICD-10-CM | POA: Diagnosis not present

## 2023-04-21 DIAGNOSIS — L820 Inflamed seborrheic keratosis: Secondary | ICD-10-CM | POA: Diagnosis not present

## 2023-05-04 ENCOUNTER — Ambulatory Visit (INDEPENDENT_AMBULATORY_CARE_PROVIDER_SITE_OTHER): Payer: PPO | Admitting: Licensed Clinical Social Worker

## 2023-05-04 ENCOUNTER — Encounter (HOSPITAL_COMMUNITY): Payer: Self-pay

## 2023-05-04 DIAGNOSIS — F411 Generalized anxiety disorder: Secondary | ICD-10-CM

## 2023-05-04 NOTE — Progress Notes (Signed)
Comprehensive Clinical Assessment (CCA) Note  05/04/2023 Tracy Hardy [id number]  Chief Complaint:  Chief Complaint  Patient presents with   Anxiety    "Dealing with my grandsons father. I have to walk on eggshells around him as I have no rights. I don't think he's stable and when my grandson goes over there and comes back to [place], I can see there's things going on."   Depression    "I have no control over this, I deal with worry and sadness relating to the situation with my grandsons father"   Visit Diagnosis: Generalized anxiety disorder    Summary: Tracy Hardy is a 67yo Caucasian, married female, with past psych hx of GAD, managed solely via medication, presenting for initial CCA, in order establish therapy services. Pt reports increased stressors over the past year due to daughter and grandson coming to live with pt, and increased stress surrounding grandson's fathers parenting approaches and communication. Pt reports additional stress surrounding the management of fathers pressing medical needs and her being the sole support provider for father. Sxs include increased worry, difficulties falling back asleep due to overthinking stressors, increased irritability, decreased frustration tolerance, and feelings overwhelmed. Pt denies SI, HI, AVH. Pt will benefit from continued engagement in OPT services in efforts to effectively manage presenting sxs.      05/04/2023   10:20 AM  GAD 7 : Generalized Anxiety Score  Nervous, Anxious, on Edge 3  Control/stop worrying 3  Worry too much - different things 3  Trouble relaxing 3  Restless 2  Easily annoyed or irritable 3  Afraid - awful might happen 3  Total GAD 7 Score 20  Anxiety Difficulty Somewhat difficult       05/04/2023   10:17 AM 01/20/2018    2:02 PM 09/14/2017    5:14 PM 09/01/2017   10:13 AM 09/29/2016    4:26 PM  Depression screen PHQ 2/9  Decreased Interest 1 0 0 0 0  Down, Depressed, Hopeless 1 0 0 0 0  PHQ - 2 Score 2 0  0 0 0  Altered sleeping 1      Tired, decreased energy 1      Change in appetite 1      Feeling bad or failure about yourself  3      Trouble concentrating 3      Moving slowly or fidgety/restless 0      Suicidal thoughts 0      PHQ-9 Score 11      Difficult doing work/chores Not difficult at all       [employer] Counselor from 05/04/2023 in [facility] ED from 08/20/2022 in [facility] at [facility] RISK CATEGORY No Risk No Risk      CCA Biopsychosocial Intake/Chief Complaint:  "Challenges managing everything all at once. My dad has cancer and has heart complications, so I have to shoulder the things he's dealing with. Then I go home and have a 67yo"  Current Symptoms/Problems: "Short temper, sometimes I sweat due to the anxiety, I feel like I should handle it better. Always overthinking what I have or haven't done. Decreased patience"   Patient Reported Schizophrenia/Schizoaffective Diagnosis in Past: No   Strengths: "I seem to hold it together around people that don't know how much it stresses me out, I take of others well"  Preferences: "I prefer in-person, maybe monthly, I think this is the first step, and continued medication management with Dr. Evelene Croon,  MD"  Abilities: No data recorded  Type of Services Patient Feels are Needed: Pt requesting to continued OPT on a monthly basis with continued medication management.   Initial Clinical Notes/Concerns: Pt is a 67yo Caucasian female with past psych hx of GAD, receiving med man via Dr. Evelene Croon, MD. Pt reports increase in anxious sxs over recent years due to daughter coming to live with her and the challenges experienced with grandsons father, additional stressors surrounding the support and management of 90y.o. father's pressing medical needs surrounding cancer and heart complications that require pt's support in navigating medical appointments, and daily needs.   Mental  Health Symptoms Depression:   None   Duration of Depressive symptoms: No data recorded  Mania:   None   Anxiety:    Difficulty concentrating; Fatigue; Irritability; Restlessness; Sleep; Worrying   Psychosis:   None   Duration of Psychotic symptoms: No data recorded  Trauma:   None   Obsessions:   None   Compulsions:   None   Inattention:   None   Hyperactivity/Impulsivity:   None   Oppositional/Defiant Behaviors:   None   Emotional Irregularity:   None   Other Mood/Personality Symptoms:  No data recorded   Mental Status Exam Appearance and self-care  Stature:   Average   Weight:   Average weight   Clothing:   Casual; Age-appropriate; Neat/clean   Grooming:   Normal   Cosmetic use:   Age appropriate   Posture/gait:   Normal   Motor activity:   Not Remarkable   Sensorium  Attention:   Normal   Concentration:   Normal   Orientation:   X5   Recall/memory:   Normal   Affect and Mood  Affect:   Anxious   Mood:   Anxious   Relating  Eye contact:   Normal   Facial expression:   Responsive   Attitude toward examiner:   Cooperative   Thought and Language  Speech flow:  Normal   Thought content:   Appropriate to Mood and Circumstances   Preoccupation:   None   Hallucinations:   None   Organization:  No data recorded  [employer] of Knowledge:   Average   Intelligence:   Average   Abstraction:   Normal   Judgement:   Normal   Reality Testing:   Adequate   Insight:   Good   Decision Making:  No data recorded  Social Functioning  Social Maturity:   Responsible   Social Judgement:   Normal   Stress  Stressors:   Family conflict   Coping Ability:   Deficient supports; Exhausted; Overwhelmed   Skill Deficits:   None   Supports:   Church; Family; Friends/Service system     Religion: Religion/Spirituality Are You A Religious Person?: Yes What is Your Religious Affiliation?:  Methodist How Might This Affect Treatment?: "I pray every night for my hatred towards him and why I let me anger ruin my life"  Leisure/Recreation: Leisure / Recreation Do You Have Hobbies?: Yes Leisure and Hobbies: Investment banker, operational, I've read more books lately"  Exercise/Diet: Exercise/Diet Do You Exercise?: Yes What Type of Exercise Do You Do?: Run/Walk, Swimming How Many Times a Week Do You Exercise?: 4-5 times a week Have You Gained or Lost A Significant Amount of Weight in the Past Six Months?: No Do You Follow a Special Diet?: No Do You Have Any Trouble Sleeping?: Yes Explanation of Sleeping Difficulties: "When I get things on my mind  and I can not let them go. Difficulties getting back to sleep when waking in middle of the night."   CCA Employment/Education Employment/Work Situation: Employment / Work Situation Employment Situation: Retired Passenger transport manager has Been Impacted by Current Illness: No What is the Longest Time Patient has Held a Job?: 27 Where was the Patient Employed at that Time?: [facility] Has Patient ever Been in the [employer]?: No  Education: Education Is Patient Currently Attending School?: No Last Grade Completed: 16 Name of High School: [employer] Did Garment/textile technologist From [employer]?: Yes Did Theme park manager?: Yes What Type of College Degree Do you Have?: Chief Operating Officer of Social Work Did Designer, television/film set?: No What Was Your Major?: Social Work Did You Have An Engineer, manufacturing (IIEP): No Did You Have Any Difficulty At [employer]?: No Patient's Education Has Been Impacted by Current Illness: No   CCA Family/Childhood History Family and Relationship History: Family history Marital status: Married Number of Years Married: 40 What types of issues is patient dealing with in the relationship?: "Just the usual" Are you sexually active?: No What is your sexual orientation?: Heterosexual Has your sexual activity been affected by drugs,  alcohol, medication, or emotional stress?: No Does patient have children?: Yes How many children?: 2 (38yo daughter, 30yo son.) How is patient's relationship with their children?: "Good. Daughter lives with [place]. Son is in the [employer] and just moved to [place], we facetime and text and talk regularly"  Childhood History:  Childhood History By whom was/is the patient raised?: Both parents Additional childhood history information: "They were both hard workers, neither one of them had college educations. Dad worked until he was 55, mother retired at probably 30. Hard working class family" Description of patient's relationship with caregiver when they were a child: "I was probably more with mother because she was around more and dad worked longer hours but we still had a good relationship" Patient's description of current relationship with people who raised him/her: "My mother's deceased, passed 05-27-2018. With dad it's fine, he just recently lost his drivers license so some of that rolls into me. Very dependent on me" How were you disciplined when you got in trouble as a child/adolescent?: "We were probably just told to go to our rooms and stay there; Washed your mouth out with soap" Does patient have siblings?: Yes Number of Siblings: 2 (2 sisters. 1 passed 05-27-2020; 60 yo sister.) Description of patient's current relationship with siblings: "Debbie and I, I can talk to her, she's so medical, so if I have a medical question I go to her. She's not really sensitive. I love her and know I'd do anything in the world for her" Did patient suffer any verbal/emotional/physical/sexual abuse as a child?: No Did patient suffer from severe childhood neglect?: No Has patient ever been sexually abused/assaulted/raped as an adolescent or adult?: No Was the patient ever a victim of a crime or a disaster?: No Witnessed domestic violence?: No Has patient been affected by domestic violence as an adult?: No  CCA Substance  Use Alcohol/Drug Use: Alcohol / Drug Use History of alcohol / drug use?: No history of alcohol / drug abuse  DSM5 Diagnoses: Patient Active Problem List   Diagnosis Date Noted   Palpitations 11/29/2017   Allergic rhinitis 04/07/2016   Anxiety 04/07/2016   GERD (gastroesophageal reflux disease) 04/07/2016    Patient Centered Plan: Patient is on the following Treatment Plan(s):  Anxiety  Collaboration of Care: Other None necessary at this  time.  Patient/Guardian was advised Release of Information must be obtained prior to any record release in order to collaborate their care with an outside provider. Patient/Guardian was advised if they have not already done so to contact the registration department to sign all necessary forms in order for [place] to release information regarding their care.   Consent: Patient/Guardian gives verbal consent for treatment and assignment of benefits for services provided during this visit. Patient/Guardian expressed understanding and agreed to proceed.   Leisa Lenz, LCSW

## 2023-05-24 DIAGNOSIS — L570 Actinic keratosis: Secondary | ICD-10-CM | POA: Diagnosis not present

## 2023-05-24 DIAGNOSIS — X32XXXD Exposure to sunlight, subsequent encounter: Secondary | ICD-10-CM | POA: Diagnosis not present

## 2023-06-05 ENCOUNTER — Ambulatory Visit (INDEPENDENT_AMBULATORY_CARE_PROVIDER_SITE_OTHER): Payer: PPO | Admitting: Licensed Clinical Social Worker

## 2023-06-05 ENCOUNTER — Ambulatory Visit (HOSPITAL_COMMUNITY): Payer: Self-pay | Admitting: Licensed Clinical Social Worker

## 2023-06-05 DIAGNOSIS — F411 Generalized anxiety disorder: Secondary | ICD-10-CM

## 2023-06-05 NOTE — Progress Notes (Signed)
THERAPIST PROGRESS NOTE   Session Date: 06/05/2023  Session Time: 0805 - 0907  Participation Level: Active  Behavioral Response: Casual, Neat, and Well GroomedAlertEuthymic  Type of Therapy: Individual Therapy  Treatment Goals addressed:  - STG: Report a decrease in anxiety symptoms as evidenced by an overall reduction in anxiety score by a minimum of 25% on the Generalized Anxiety Disorder Scale (GAD-7) (Anxiety) - LTG: Improve abilities at effectively managing daily stressors and implementing boundaries in efforts to reduce feelings of overwhelming stress and increased irritability. (Anxiety)  ProgressTowards Goals: Progressing  Interventions: CBT, Solution Focused, and Supportive  Summary: Tracy Hardy is a 67 y.o. female with past psych history of GAD, presenting for follow-up therapy session in efforts to improve management of anxious symptoms. Patient actively engaged in session, providing recounts of past month and identifying areas in which pt has seen progression in her individual management of anxious sxs. Pt detailed recent events occurring within daily life that pt has proven to acknowledge and understand her individual level of control and further allowing such factors to impact her. Re-administered PHQ9 and GAD7, identifying noted progress and further exploring the areas in which pt has proven to reduce overall levels of stress. Patient responded well to interventions. Patient continues to meet criteria for GAD. Patient will continue to benefit from engagement in outpatient therapy due to being the least restrictive service to meet presenting needs.     06/05/2023    8:05 AM 05/04/2023   10:17 AM 01/20/2018    2:02 PM 09/14/2017    5:14 PM 09/01/2017   10:13 AM  Depression screen PHQ 2/9  Decreased Interest 0 1 0 0 0  Down, Depressed, Hopeless 0 1 0 0 0  PHQ - 2 Score 0 2 0 0 0  Altered sleeping 1 1     Tired, decreased energy 0 1     Change in appetite 0 1     Feeling bad or  failure about yourself  0 3     Trouble concentrating 0 3     Moving slowly or fidgety/restless 0 0     Suicidal thoughts 0 0     PHQ-9 Score 1 11     Difficult doing work/chores Not difficult at all Not difficult at all         06/05/2023    8:09 AM 05/04/2023   10:20 AM  GAD 7 : Generalized Anxiety Score  Nervous, Anxious, on Edge 1 3  Control/stop worrying 1 3  Worry too much - different things 0 3  Trouble relaxing 1 3  Restless 0 2  Easily annoyed or irritable 0 3  Afraid - awful might happen 0 3  Total GAD 7 Score 3 20  Anxiety Difficulty Not difficult at all Somewhat difficult   Suicidal/Homicidal: No  Therapist Response: Clinician utilized CBT, Solution Focused, and supportive reflections to address anxious sxs and reported stressors. Re-administered PHQ9 and GAD7, identifying variances in scoring and overall reductions, encouraging pt to identify areas within life in which she has proven to improve the overall management of depressive and anxious sxs. Therapist provided support and empathy to patient during session.  Plan: Return again in 4 weeks.  Diagnosis:  Encounter Diagnosis  Name Primary?   Generalized anxiety disorder Yes    Collaboration of Care: Other None necessary at this time.  Patient/Guardian was advised Release of Information must be obtained prior to any record release in order to collaborate their care with an outside provider. Patient/Guardian  was advised if they have not already done so to contact the registration department to sign all necessary forms in order for [place] to release information regarding their care.   Consent: Patient/Guardian gives verbal consent for treatment and assignment of benefits for services provided during this visit. Patient/Guardian expressed understanding and agreed to proceed.   Leisa Lenz, MSW, LCSW 06/05/2023,  7:58 AM

## 2023-07-02 IMAGING — MG MM DIGITAL SCREENING BILAT W/ TOMO AND CAD
8 series · 8 of 24 positions shown · non-contrast
Comparison: Previous exam(s).

CLINICAL DATA: Screening.

EXAM:
DIGITAL SCREENING BILATERAL MAMMOGRAM WITH TOMOSYNTHESIS AND CAD
TECHNIQUE: Bilateral screening digital craniocaudal and mediolateral oblique
mammograms were obtained. Bilateral screening digital breast
tomosynthesis was performed. The images were evaluated with
computer-aided detection.

[R CC synth-2D]
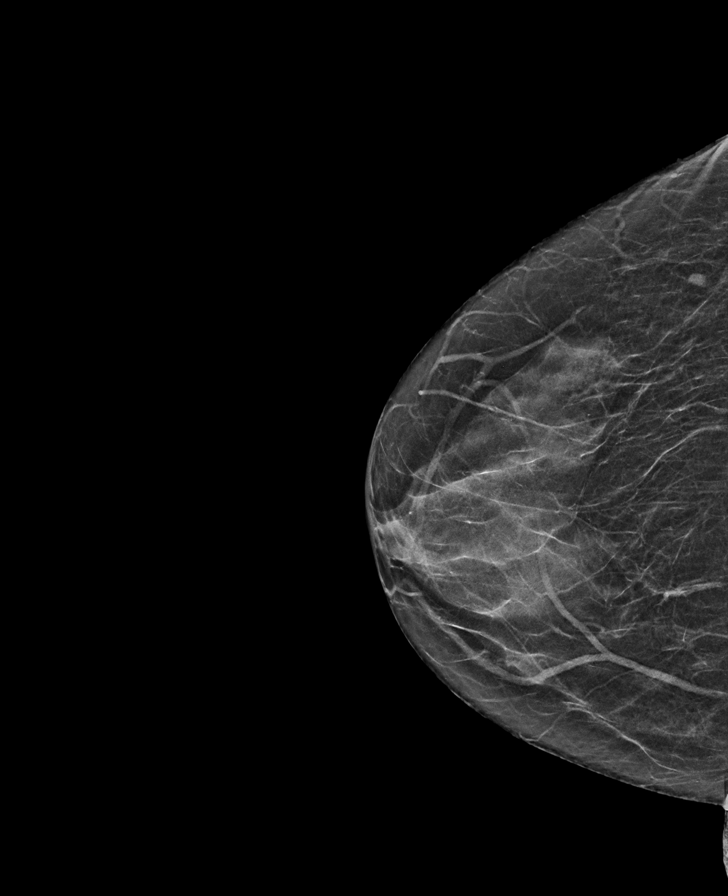

[R MLO synth-2D]
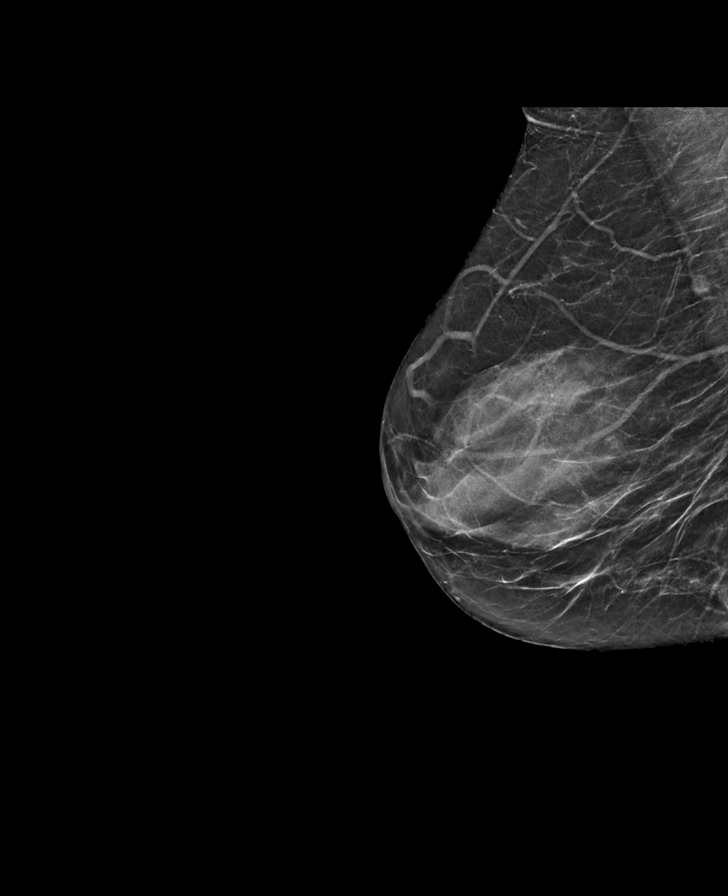

[L MLO synth-2D]
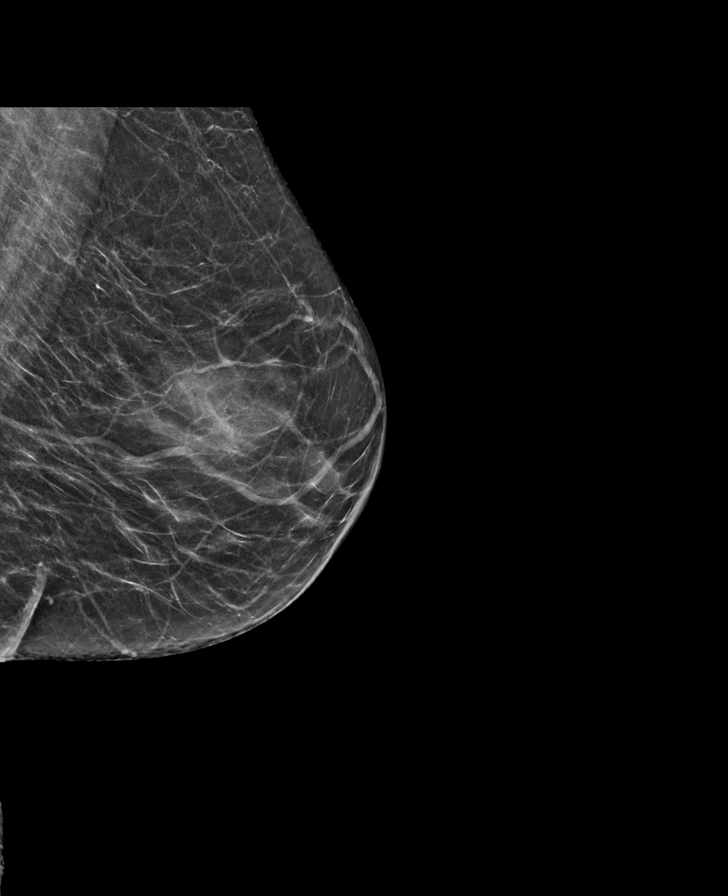

[L CC synth-2D]
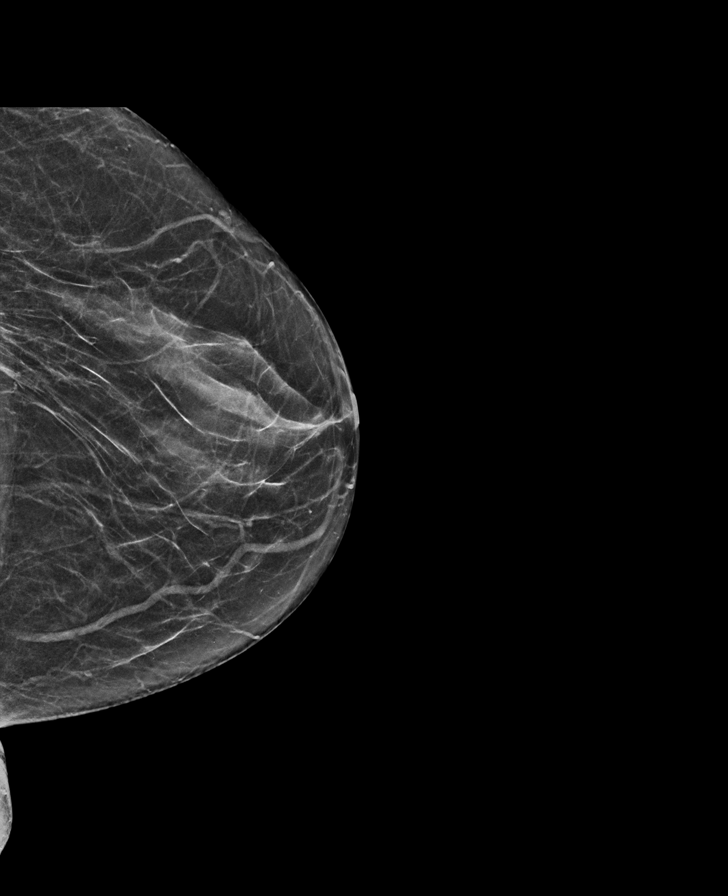

[L MLO tomo · tomo slice 27/52.0]
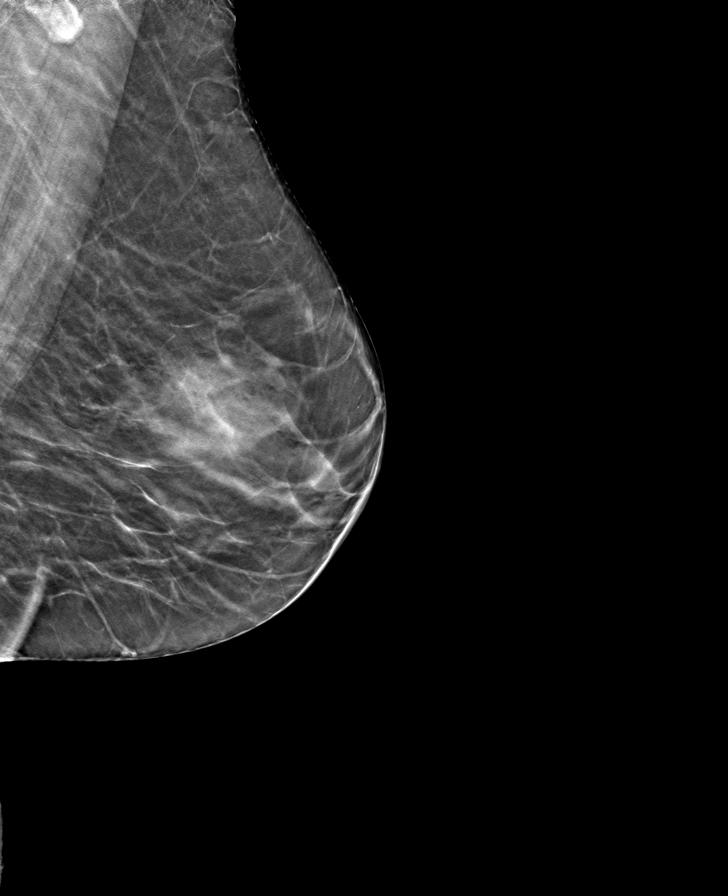

[R MLO tomo · tomo slice 25/50.0]
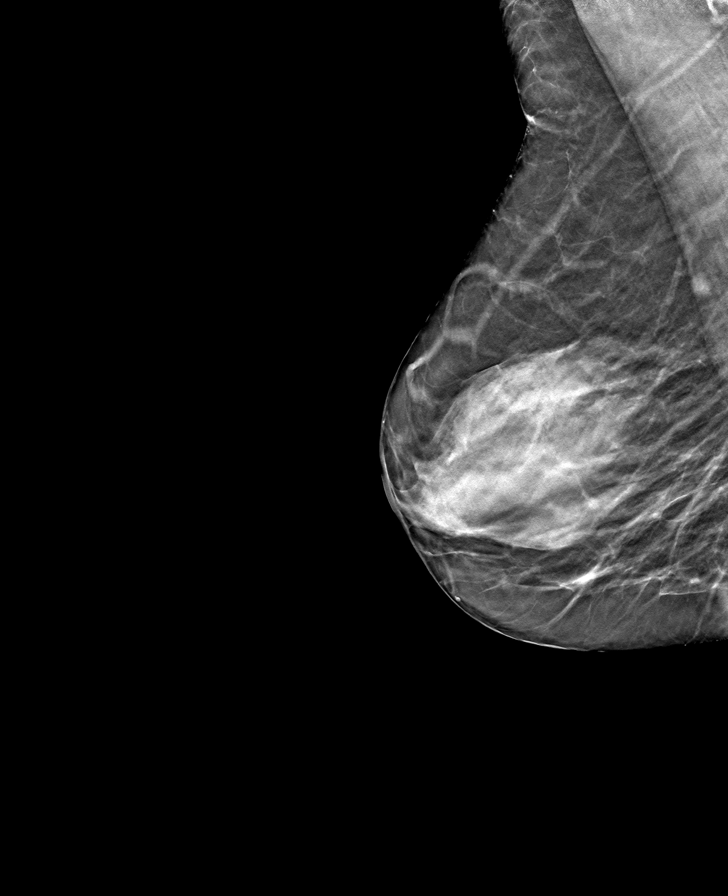

[R CC tomo · tomo slice 25/48.0]
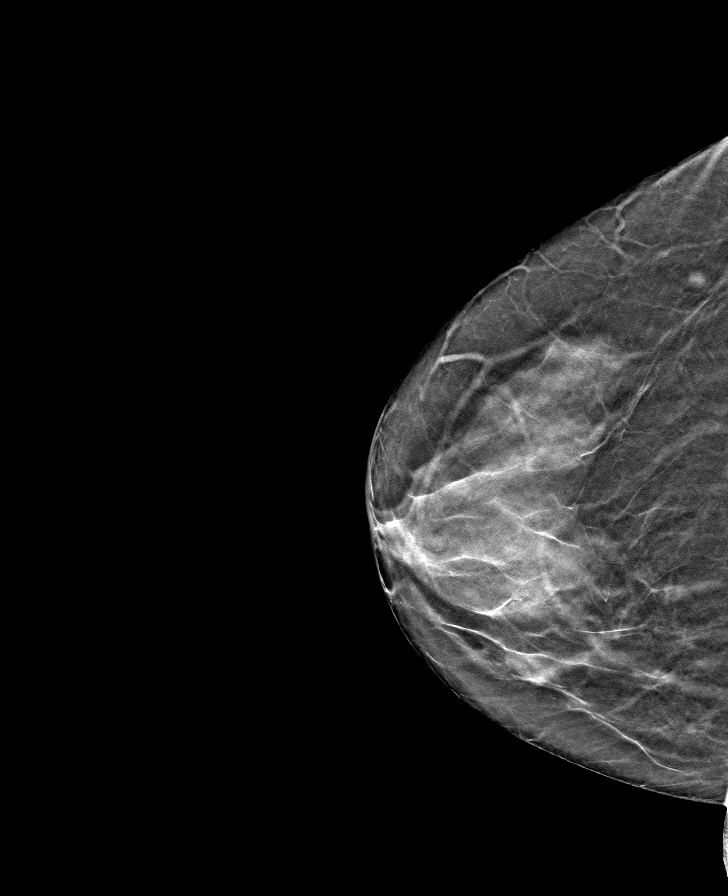

[L CC tomo · tomo slice 27/52.0]
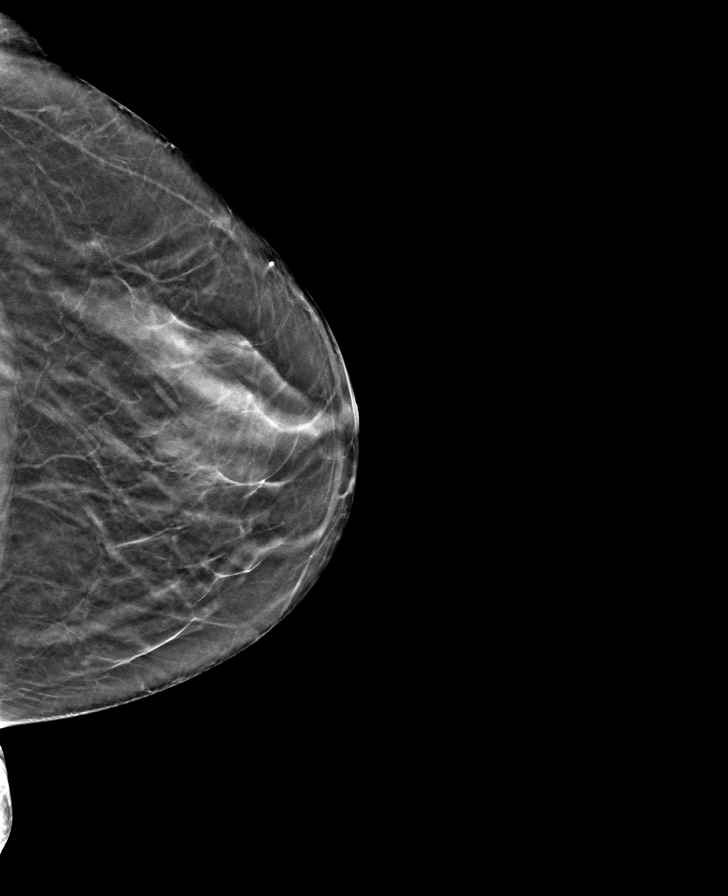

[8 of 24 positions shown; findings below may reference images not displayed]

ACR Breast Density Category c: The breast tissue is heterogeneously
dense, which may obscure small masses.
FINDINGS: There are no findings suspicious for malignancy.
IMPRESSION: No mammographic evidence of malignancy. A result letter of this
screening mammogram will be mailed directly to the patient.

RECOMMENDATION:
Screening mammogram in one year. (Code:[id number])

BI-RADS CATEGORY  1: Negative.

## 2023-07-03 ENCOUNTER — Ambulatory Visit (HOSPITAL_COMMUNITY): Payer: Self-pay | Admitting: Licensed Clinical Social Worker

## 2023-07-04 ENCOUNTER — Ambulatory Visit (HOSPITAL_COMMUNITY): Payer: Self-pay | Admitting: Licensed Clinical Social Worker

## 2023-07-07 ENCOUNTER — Ambulatory Visit (INDEPENDENT_AMBULATORY_CARE_PROVIDER_SITE_OTHER): Payer: PPO | Admitting: Licensed Clinical Social Worker

## 2023-07-07 DIAGNOSIS — F411 Generalized anxiety disorder: Secondary | ICD-10-CM | POA: Diagnosis not present

## 2023-07-07 NOTE — Progress Notes (Unsigned)
THERAPIST PROGRESS NOTE   Session Date: 07/07/2023  Session Time: 0802 - 0902  Participation Level: Active  Behavioral Response: Casual, Neat, and Well GroomedAlertEuthymic  Type of Therapy: Individual Therapy  Treatment Goals addressed:  - STG: Report a decrease in anxiety symptoms as evidenced by an overall reduction in anxiety score by a minimum of 25% on the Generalized Anxiety Disorder Scale (GAD-7) (Anxiety) - LTG: Improve abilities at effectively managing daily stressors and implementing boundaries in efforts to reduce feelings of overwhelming stress and increased irritability. (Anxiety)  ProgressTowards Goals: Progressing  Interventions: CBT, Solution Focused, and Supportive  Summary: Tracy Hardy is a 67 y.o. female with past psych history of GAD, presenting for follow-up therapy session in efforts to improve management of anxious symptoms. Patient actively engaged in session, providing recounts of the past month since previous session, sharing of minimal stress related to approaching holidays and general maintained reduced level of depression and anxious symptoms across settings. Actively explored ongoing presenting stressors related to provision of care for father and pt's prior expectations of retirement and variances since assuming care of father. Processed various means of supports and ways in which pt could reallocate stressors to prioritize own needs. Patient responded well to interventions. Patient continues to meet criteria for GAD. Patient will continue to benefit from engagement in outpatient therapy due to being the least restrictive service to meet presenting needs.     06/05/2023    8:05 AM 05/04/2023   10:17 AM 01/20/2018    2:02 PM 09/14/2017    5:14 PM 09/01/2017   10:13 AM  Depression screen PHQ 2/9  Decreased Interest 0 1 0 0 0  Down, Depressed, Hopeless 0 1 0 0 0  PHQ - 2 Score 0 2 0 0 0  Altered sleeping 1 1     Tired, decreased energy 0 1     Change in appetite  0 1     Feeling bad or failure about yourself  0 3     Trouble concentrating 0 3     Moving slowly or fidgety/restless 0 0     Suicidal thoughts 0 0     PHQ-9 Score 1 11     Difficult doing work/chores Not difficult at all Not difficult at all         06/05/2023    8:09 AM 05/04/2023   10:20 AM  GAD 7 : Generalized Anxiety Score  Nervous, Anxious, on Edge 1 3  Control/stop worrying 1 3  Worry too much - different things 0 3  Trouble relaxing 1 3  Restless 0 2  Easily annoyed or irritable 0 3  Afraid - awful might happen 0 3  Total GAD 7 Score 3 20  Anxiety Difficulty Not difficult at all Somewhat difficult   Suicidal/Homicidal: No  Therapist Response: Clinician utilized CBT, Solution Focused, and supportive reflections to address anxious sxs and reported stressors. Engaged in reflection of recent events, processing pt's recounts of thoughts and feelings surrounding ongoing challenges. Evoked pt's thoughts and perspectives surrounding potential supports and pt's efforts towards prioritizing self and own needs. Therapist provided support and empathy to patient during session.  Plan: Return again in 4 weeks.  Diagnosis:  Encounter Diagnosis  Name Primary?   Generalized anxiety disorder Yes     Collaboration of Care: Other None necessary at this time.  Patient/Guardian was advised Release of Information must be obtained prior to any record release in order to collaborate their care with an outside provider. Patient/Guardian was  advised if they have not already done so to contact the registration department to sign all necessary forms in order for [place] to release information regarding their care.   Consent: Patient/Guardian gives verbal consent for treatment and assignment of benefits for services provided during this visit. Patient/Guardian expressed understanding and agreed to proceed.   Leisa Lenz, MSW, LCSW 07/07/2023,  9:02 AM

## 2023-07-31 ENCOUNTER — Ambulatory Visit (INDEPENDENT_AMBULATORY_CARE_PROVIDER_SITE_OTHER): Payer: PPO | Admitting: Licensed Clinical Social Worker

## 2023-07-31 DIAGNOSIS — F411 Generalized anxiety disorder: Secondary | ICD-10-CM | POA: Diagnosis not present

## 2023-07-31 NOTE — Progress Notes (Signed)
 THERAPIST PROGRESS NOTE   Session Date: 07/31/2023  Session Time: 1010 - 1056  Participation Level: Active  Behavioral Response: Casual, Neat, and Well GroomedAlertEuthymic  Type of Therapy: Individual Therapy  Treatment Goals addressed:  - STG: Report a decrease in anxiety symptoms as evidenced by an overall reduction in anxiety score by a minimum of 25% on the Generalized Anxiety Disorder Scale (GAD-7) (Anxiety) - LTG: Improve abilities at effectively managing daily stressors and implementing boundaries in efforts to reduce feelings of overwhelming stress and increased irritability. (Anxiety)  ProgressTowards Goals: Progressing  Interventions: CBT, Solution Focused, and Supportive  Summary: Tracy Hardy is a 67 y.o. female with past psych history of GAD, presenting for follow-up therapy session in efforts to improve management of anxious symptoms. Patient actively engaged in session, maintaining pleasant moods throughout, providing recounts of the past month of events since previous session. Pt actively detailed events surrounding past holidays and increased stress related to son visiting, schools being out due to inclement weather conditions further impacting pt's schedule due to caring for grandson, and stress surrounding father's continued decline in health AEB recent fall and increased frequency of challenges completing tasks within the home. Revisited previously explored areas of concern related to pt's efforts at prioritizing own needs, and increasing in engagement of physical activity to support pt's healthy means of stress management.  Patient responded well to interventions. Patient continues to meet criteria for GAD. Patient will continue to benefit from engagement in outpatient therapy due to being the least restrictive service to meet presenting needs.      07/31/2023   10:14 AM 06/05/2023    8:05 AM 05/04/2023   10:17 AM 01/20/2018    2:02 PM 09/14/2017    5:14 PM  Depression  screen PHQ 2/9  Decreased Interest 0 0 1 0 0  Down, Depressed, Hopeless 0 0 1 0 0  PHQ - 2 Score 0 0 2 0 0  Altered sleeping 0 1 1    Tired, decreased energy 0 0 1    Change in appetite 0 0 1    Feeling bad or failure about yourself  0 0 3    Trouble concentrating 0 0 3    Moving slowly or fidgety/restless 0 0 0    Suicidal thoughts 0 0 0    PHQ-9 Score 0 1 11    Difficult doing work/chores Not difficult at all Not difficult at all Not difficult at all        07/31/2023   10:12 AM 06/05/2023    8:09 AM 05/04/2023   10:20 AM  GAD 7 : Generalized Anxiety Score  Nervous, Anxious, on Edge 1 1 3   Control/stop worrying 1 1 3   Worry too much - different things 0 0 3  Trouble relaxing 1 1 3   Restless 0 0 2  Easily annoyed or irritable 1 0 3  Afraid - awful might happen 0 0 3  Total GAD 7 Score 4 3 20   Anxiety Difficulty Not difficult at all Not difficult at all Somewhat difficult   Suicidal/Homicidal: No  Therapist Response: Clinician utilized CBT, Solution Focused, and supportive reflections to address anxious sxs and reported stressors. Engaged pt in reflection of recent events surrounding recent holidays, eliciting pt's recounts of stressors and challenges, abilities at managing presenting stressors, and pt's efforts at prioritizing own individual management of stress.  Further evoked patient's thoughts, feelings, and perspectives surrounding efforts towards prioritizing self, and intern secondary gains of improving management of stress and reduction  of anxious symptoms.  Engaged in reflection of course of treatment over the past 3 months, processing significant reduction in depressive and anxious symptoms ranging from initial assessment to date.  Clinician reassessed severity of depressive and anxious sxs, and presence of any safety concerns. Therapist provided support and empathy to patient during session.  Plan: Return again in 4 weeks.  Diagnosis:  Encounter Diagnosis  Name  Primary?   Generalized anxiety disorder Yes      Collaboration of Care: Other None necessary at this time.  Patient/Guardian was advised Release of Information must be obtained prior to any record release in order to collaborate their care with an outside provider. Patient/Guardian was advised if they have not already done so to contact the registration department to sign all necessary forms in order for us  to release information regarding their care.   Consent: Patient/Guardian gives verbal consent for treatment and assignment of benefits for services provided during this visit. Patient/Guardian expressed understanding and agreed to proceed.   Tracy Hardy, MSW, LCSW 07/31/2023,  10:18 AM

## 2023-08-28 ENCOUNTER — Ambulatory Visit (INDEPENDENT_AMBULATORY_CARE_PROVIDER_SITE_OTHER): Payer: PPO | Admitting: Licensed Clinical Social Worker

## 2023-08-28 DIAGNOSIS — F411 Generalized anxiety disorder: Secondary | ICD-10-CM

## 2023-08-28 NOTE — Progress Notes (Signed)
 THERAPIST PROGRESS NOTE   Session Date: 08/28/2023  Session Time: 0900 - 1002  Participation Level: Active  Behavioral Response: CasualAlertEuthymic  Type of Therapy: Individual Therapy  Treatment Goals addressed:  - STG: Report a decrease in anxiety symptoms as evidenced by an overall reduction in anxiety score by a minimum of 25% on the Generalized Anxiety Disorder Scale (GAD-7) (Anxiety) MET - LTG: Improve abilities at effectively managing daily stressors and implementing boundaries in efforts to reduce feelings of overwhelming stress and increased irritability. (Anxiety)  ProgressTowards Goals: Progressing  Interventions: CBT, Solution Focused, and Supportive  Summary: Tracy Hardy is a 68 y.o. female with past psych history of GAD, presenting for follow-up therapy session in efforts to improve management of anxious symptoms. Patient actively engaged in session, maintaining pleasant moods throughout, providing recounts of the past month of events since previous session. Engaged in re-assessing of GAD-7, processing continued downward trend in scores over past 3 months, processing thoughts and perspectives in relation to individual efforts towards improving management of anxious sxs by implementing healthy boundaries.  - Pt actively detailed recent events, sharing of increased stress and anxiety surrounding father's continued declining health having experienced additional falls in the home over recent weeks, creating additional responsibilities and requiring more of pt to care for within father's home. - Engaged in review of tx goals surrounding the management of anxious sxs, determining successful completion of one goal, and noted continued areas of progress identified for second goal. - Further processed recent anxiety experienced earlier this morning surrounding items of clothing and grandsons father keeping certain items based on beliefs that they were purchased by him, processing thoughts  and feelings related to events and interactions related to control and not understanding reasoning. - Explored pt's continued efforts and observed benefits/progressions from implementation of boundaries and adherences to them to aid in the reduction of stress and anxiety.  Patient responded well to interventions. Patient continues to meet criteria for GAD. Patient will continue to benefit from engagement in outpatient therapy due to being the least restrictive service to meet presenting needs.      08/28/2023    9:09 AM 07/31/2023   10:12 AM 06/05/2023    8:09 AM 05/04/2023   10:20 AM  GAD 7 : Generalized Anxiety Score  Nervous, Anxious, on Edge 1 1 1 3   Control/stop worrying 0 1 1 3   Worry too much - different things 1 0 0 3  Trouble relaxing 0 1 1 3   Restless 0 0 0 2  Easily annoyed or irritable 0 1 0 3  Afraid - awful might happen 1 0 0 3  Total GAD 7 Score 3 4 3 20   Anxiety Difficulty Not difficult at all Not difficult at all Not difficult at all Somewhat difficult   Suicidal/Homicidal: No  Therapist Response: Clinician utilized CBT, Solution Focused, and supportive reflections to address anxious sxs and reported stressors. Reviewed PHQ-9 screenings, noting of depressive sxs being secondary to anxiety, discontinuing need to administer PHQ-9, engaging pt in re-administering of GAD-7, processing current scores, and continued downward trends. Engaged pt in reflection of efforts surrounding management of anxious sxs, and how boundaries prove to be supporting pt's management of anxious challenges and stressors.  Clinician reassessed severity of depressive and anxious sxs, and presence of any safety concerns. Therapist provided support and empathy to patient during session.  Plan: Return again in 6 weeks.  Diagnosis:  Encounter Diagnosis  Name Primary?   Generalized anxiety disorder Yes    Collaboration of  Care: Other None necessary at this time.  Patient/Guardian was advised  Release of Information must be obtained prior to any record release in order to collaborate their care with an outside provider. Patient/Guardian was advised if they have not already done so to contact the registration department to sign all necessary forms in order for us  to release information regarding their care.   Consent: Patient/Guardian gives verbal consent for treatment and assignment of benefits for services provided during this visit. Patient/Guardian expressed understanding and agreed to proceed.   Patsi Boots, MSW, LCSW 08/28/2023,  10:20 AM

## 2023-09-06 DIAGNOSIS — E559 Vitamin D deficiency, unspecified: Secondary | ICD-10-CM | POA: Diagnosis not present

## 2023-09-06 DIAGNOSIS — R7309 Other abnormal glucose: Secondary | ICD-10-CM | POA: Diagnosis not present

## 2023-09-06 DIAGNOSIS — Z13220 Encounter for screening for lipoid disorders: Secondary | ICD-10-CM | POA: Diagnosis not present

## 2023-09-06 DIAGNOSIS — E041 Nontoxic single thyroid nodule: Secondary | ICD-10-CM | POA: Diagnosis not present

## 2023-09-06 DIAGNOSIS — Z13820 Encounter for screening for osteoporosis: Secondary | ICD-10-CM | POA: Diagnosis not present

## 2023-09-06 DIAGNOSIS — Z79899 Other long term (current) drug therapy: Secondary | ICD-10-CM | POA: Diagnosis not present

## 2023-09-13 ENCOUNTER — Telehealth: Payer: Self-pay

## 2023-09-13 DIAGNOSIS — R7309 Other abnormal glucose: Secondary | ICD-10-CM | POA: Diagnosis not present

## 2023-09-13 DIAGNOSIS — Z79899 Other long term (current) drug therapy: Secondary | ICD-10-CM | POA: Diagnosis not present

## 2023-09-13 DIAGNOSIS — E041 Nontoxic single thyroid nodule: Secondary | ICD-10-CM | POA: Diagnosis not present

## 2023-09-13 DIAGNOSIS — Z0000 Encounter for general adult medical examination without abnormal findings: Secondary | ICD-10-CM | POA: Diagnosis not present

## 2023-09-13 DIAGNOSIS — M8580 Other specified disorders of bone density and structure, unspecified site: Secondary | ICD-10-CM | POA: Diagnosis not present

## 2023-09-13 DIAGNOSIS — E559 Vitamin D deficiency, unspecified: Secondary | ICD-10-CM | POA: Diagnosis not present

## 2023-09-13 DIAGNOSIS — M8589 Other specified disorders of bone density and structure, multiple sites: Secondary | ICD-10-CM | POA: Diagnosis not present

## 2023-09-13 NOTE — Patient Instructions (Signed)
 Visit Information  Thank you for taking time to visit with me today. Please don't hesitate to contact me if I can be of assistance to you.   Following are the goals we discussed today:   Goals Addressed             This Visit's Progress    COMPLETED: Care Coordination Activities-No follow up required       Care Coordination Interventions: Discussed services and support. Advised to discuss with primary care physician if services needed in the future.          If you are experiencing a Mental Health or Behavioral Health Crisis or need someone to talk to, please call the Suicide and Crisis Lifeline: 988   The patient verbalized understanding of instructions, educational materials, and care plan provided today and DECLINED offer to receive copy of patient instructions, educational materials, and care plan.   Follow up with provider re: regarding care management services if needed in the future.  Bary Leriche RN, MSN [facility], [facility] RN Care Manager Direct Dial: [phone]  Fax: [phone] Website: [email or web address]

## 2023-09-13 NOTE — Patient Outreach (Signed)
 Care Coordination   In Person Provider Office Visit Note   09/13/2023 Name: Tracy Hardy MRN: [id number] DOB: 08-27-1955  Tracy Hardy is a 68 y.o. year old female who sees Irena Reichmann, [place] for primary care. I engaged with Tracy Hardy in the providers office today.  What matters to the patients health and wellness today?  none    Goals Addressed             This Visit's Progress    COMPLETED: Care Coordination Activities-No follow up required       Care Coordination Interventions: Discussed services and support. Advised to discuss with primary care physician if services needed in the future.         SDOH assessments and interventions completed:  No     Care Coordination Interventions:  Yes, provided   Follow up plan: No further intervention required.   Encounter Outcome:  Patient Visit Completed   Bary Leriche RN, MSN [facility] [address] [facility] RN Care Manager Direct Dial: [phone]  Fax: [phone] Website: [email or web address]

## 2023-09-18 DIAGNOSIS — M25561 Pain in right knee: Secondary | ICD-10-CM | POA: Diagnosis not present

## 2023-10-09 ENCOUNTER — Ambulatory Visit (HOSPITAL_COMMUNITY): Payer: PPO | Admitting: Licensed Clinical Social Worker

## 2023-11-15 DIAGNOSIS — M25561 Pain in right knee: Secondary | ICD-10-CM | POA: Diagnosis not present

## 2023-11-27 ENCOUNTER — Other Ambulatory Visit: Payer: Self-pay | Admitting: Family Medicine

## 2023-11-27 DIAGNOSIS — Z0000 Encounter for general adult medical examination without abnormal findings: Secondary | ICD-10-CM

## 2023-11-29 ENCOUNTER — Ambulatory Visit
Admission: RE | Admit: 2023-11-29 | Discharge: 2023-11-29 | Disposition: A | Discharge status: Home / Self Care | Source: Ambulatory Visit | Attending: Family Medicine | Admitting: Family Medicine

## 2023-11-29 DIAGNOSIS — Z1231 Encounter for screening mammogram for malignant neoplasm of breast: Secondary | ICD-10-CM | POA: Diagnosis not present

## 2023-11-29 DIAGNOSIS — Z0000 Encounter for general adult medical examination without abnormal findings: Secondary | ICD-10-CM

## 2023-12-02 ENCOUNTER — Other Ambulatory Visit: Payer: Self-pay

## 2023-12-02 ENCOUNTER — Encounter (HOSPITAL_COMMUNITY): Payer: Self-pay | Admitting: Emergency Medicine

## 2023-12-02 ENCOUNTER — Ambulatory Visit (HOSPITAL_COMMUNITY)
Admission: EM | Admit: 2023-12-02 | Discharge: 2023-12-02 | Disposition: A | Discharge status: Home / Self Care | Attending: Physician Assistant | Admitting: Physician Assistant

## 2023-12-02 DIAGNOSIS — S81811A Laceration without foreign body, right lower leg, initial encounter: Secondary | ICD-10-CM

## 2023-12-02 NOTE — ED Provider Notes (Signed)
 [facility]    CSN: [id number] Arrival date & time: 12/02/23  1130      History   Chief Complaint No chief complaint on file.   HPI Tracy Hardy is a 68 y.o. female.   Patient presents today with a several hour history of a wound to her right lower leg.  Reports that she was making the bed at her father's house when she hit a metal edge of the bed frame that caused a skin avulsion.  She immediately rinsed the area and applied Polysporin before presenting to our clinic.  She is confident that she is up-to-date on her tetanus vaccine as she knows that it was given after the COVID-19 pandemic began.  She denies any numbness or paresthesias in her foot.  She is able to ambulate unassisted.  She has no concern for retained foreign body.    Past Medical History:  Diagnosis Date   Allergy    GERD (gastroesophageal reflux disease)     Patient Active Problem List   Diagnosis Date Noted   Generalized anxiety disorder 05/04/2023   Palpitations 11/29/2017   Allergic rhinitis 04/07/2016   Anxiety 04/07/2016   GERD (gastroesophageal reflux disease) 04/07/2016    Past Surgical History:  Procedure Laterality Date   CESAREAN SECTION     JOINT REPLACEMENT     hip replacement   TONSILLECTOMY     TUBAL LIGATION      OB History   No obstetric history on file.      Home Medications    Prior to Admission medications   Medication Sig Start Date End Date Taking? Authorizing Provider  ALPRAZolam  (XANAX ) 0.5 MG tablet Take 0.5 mg by mouth 3 (three) times daily.    [provider]  cetirizine  (ZYRTEC ) 10 MG tablet Take 10 mg by mouth daily.    [provider]  Cholecalciferol 10 MCG (400 UNIT) CAPS Vitamin D3 10 mcg (400 unit) capsule    [provider]  FLUoxetine (PROZAC) 20 MG capsule Take 20 mg by mouth daily. 11/12/17   [provider]  fluticasone  (FLONASE ) 50 MCG/ACT nasal spray Place 2 sprays into both nostrils at bedtime.     [provider]    Family History Family History  Problem Relation Age of Onset   Hypertension Mother    Atrial fibrillation Mother    Healthy Father    Parkinson's disease Sister    Healthy Sister     Social History Social History   Tobacco Use   Smoking status: Former    Current packs/day: 0.00    Types: Cigarettes    Quit date: 04/07/1993    Years since quitting: 30.6   Smokeless tobacco: Never  Vaping Use   Vaping status: Never Used  Substance Use Topics   Alcohol use: Yes    Alcohol/week: 1.0 standard drink of alcohol    Types: 1 Cans of beer per week   Drug use: No     Allergies   Codeine   Review of Systems Review of Systems  Constitutional:  Negative for activity change, appetite change, fatigue and fever.  Musculoskeletal:  Negative for arthralgias, gait problem and myalgias.  Skin:  Positive for wound.  Neurological:  Negative for weakness and numbness.     Physical Exam Triage Vital Signs ED Triage Vitals  Encounter Vitals Group     BP 12/02/23 1233 113/78     Systolic BP Percentile --      Diastolic BP Percentile --  Pulse Rate 12/02/23 1233 66     Resp 12/02/23 1233 20     Temp 12/02/23 1233 98 F (36.7 C)     Temp Source 12/02/23 1233 Oral     SpO2 12/02/23 1233 99 %     Weight --      Height --      Head Circumference --      Peak Flow --      Pain Score 12/02/23 1229 0     Pain Loc --      Pain Education --      Exclude from Growth Chart --    No data found.  Updated Vital Signs BP 113/78 (BP Location: Left Arm)   Pulse 66   Temp 98 F (36.7 C) (Oral)   Resp 20   SpO2 99%   Visual Acuity Right Eye Distance:   Left Eye Distance:   Bilateral Distance:    Right Eye Near:   Left Eye Near:    Bilateral Near:     Physical Exam Vitals reviewed.  Constitutional:      General: She is awake. She is not in acute distress.    Appearance: Normal appearance. She is well-developed. She is not ill-appearing.      Comments: Very pleasant female appears stated age in no acute distress sitting comfortably in exam room  HENT:     Head: Normocephalic and atraumatic.  Cardiovascular:     Pulses:          Posterior tibial pulses are 2+ on the right side.     Comments: Capillary fill within 2 seconds right toes Pulmonary:     Effort: Pulmonary effort is normal. No tachypnea, accessory muscle usage or respiratory distress.  Musculoskeletal:     Comments: Right leg: Normal active range of motion at knee and ankle.  No tenderness over fibula.  No deformity noted.  Skin:    Findings: Wound present.     Comments: 3 cm x 4 cm V-shaped skin avulsion with intact flap noted lateral right lower leg.  Psychiatric:        Behavior: Behavior is cooperative.      UC Treatments / Results  Labs (all labs ordered are listed, but only abnormal results are displayed) Labs Reviewed - No data to display  EKG   Radiology No results found.  Procedures Laceration Repair  Date/Time: 12/02/2023 2:38 PM  Performed by: Budd Cargo, PA-C Authorized by: Budd Cargo, PA-C   Consent:    Consent obtained:  Verbal   Consent given by:  Patient   Risks discussed:  Infection, pain, poor wound healing, poor cosmetic result and need for additional repair Universal protocol:    Procedure explained and questions answered to patient or proxy's satisfaction: yes     Patient identity confirmed:  Verbally with patient Laceration details:    Location:  Leg   Leg location:  R lower leg   Length (cm):  7 Pre-procedure details:    Preparation:  Patient was prepped and draped in usual sterile fashion Exploration:    Hemostasis achieved with:  Direct pressure Treatment:    Area cleansed with:  Chlorhexidine and saline   Amount of cleaning:  Standard   Irrigation solution:  Tap water   Irrigation method:  Syringe Skin repair:    Repair method:  Sutures   Suture size:  4-0   Suture material:  Prolene   Suture  technique:  Simple interrupted   Number of  sutures:  11 Approximation:    Approximation:  Close Repair type:    Repair type:  Simple Post-procedure details:    Dressing:  Non-adherent dressing   Procedure completion:  Tolerated  (including critical care time)  Medications Ordered in UC Medications - No data to display  Initial Impression / Assessment and Plan / UC Course  I have reviewed the triage vital signs and the nursing notes.  Pertinent labs & imaging results that were available during my care of the patient were reviewed by me and considered in my medical decision making (see chart for details).     Foot is neurovascularly intact.  Skin flap was reapproximated and sutured in place but we discussed that this is likely nonviable and we will likely become an eschar but will hopefully provide protection as the wound heals.  She was encouraged to keep the area clean and come back with any signs of infection.  She will return in 10 to 14 days to have sutures removed.  She is up-to-date on tetanus.  Strict return precautions were given and she expressed understanding and agreement with treatment plan.  Final Clinical Impressions(s) / UC Diagnoses   Final diagnoses:  Laceration of right lower extremity excluding thigh, initial encounter     Discharge Instructions      We placed 11 sutures today.  Keep this area clean with soap and water.  Avoid submerging in water such as in a bath or in a pool.  If you have any signs of infection including bleeding, drainage, redness, pain, swelling, fever, nausea, vomiting you need to be seen immediately.  Please return in 10 to 14 days to have the sutures removed.  Use Tylenol and ibuprofen for pain.  Keep it elevated is much as possible at rest and avoid prolonged standing or walking.   ED Prescriptions   None    PDMP not reviewed this encounter.   Budd Cargo, PA-C 12/02/23 1440

## 2023-12-02 NOTE — Discharge Instructions (Signed)
 We placed 11 sutures today.  Keep this area clean with soap and water.  Avoid submerging in water such as in a bath or in a pool.  If you have any signs of infection including bleeding, drainage, redness, pain, swelling, fever, nausea, vomiting you need to be seen immediately.  Please return in 10 to 14 days to have the sutures removed.  Use Tylenol and ibuprofen for pain.  Keep it elevated is much as possible at rest and avoid prolonged standing or walking.

## 2023-12-02 NOTE — ED Triage Notes (Signed)
 Patient was making a bed and hit right lower leg on bed frame.  Last tetanus was in '21 or '22.  Right lower leg with partial avulsion, flap intact .  Bleeding controlled .

## 2023-12-05 DIAGNOSIS — M25561 Pain in right knee: Secondary | ICD-10-CM | POA: Diagnosis not present

## 2023-12-15 DIAGNOSIS — S61309A Unspecified open wound of unspecified finger with damage to nail, initial encounter: Secondary | ICD-10-CM | POA: Diagnosis not present

## 2023-12-15 DIAGNOSIS — L0390 Cellulitis, unspecified: Secondary | ICD-10-CM | POA: Diagnosis not present

## 2023-12-17 ENCOUNTER — Ambulatory Visit (HOSPITAL_COMMUNITY)
Admission: RE | Admit: 2023-12-17 | Discharge: 2023-12-17 | Disposition: A | Discharge status: Home / Self Care | Source: Ambulatory Visit | Attending: Internal Medicine | Admitting: Internal Medicine

## 2023-12-17 ENCOUNTER — Other Ambulatory Visit: Payer: Self-pay

## 2023-12-17 ENCOUNTER — Encounter (HOSPITAL_COMMUNITY): Payer: Self-pay

## 2023-12-17 VITALS — BP 113/70 | HR 58 | Resp 20

## 2023-12-17 DIAGNOSIS — Z4802 Encounter for removal of sutures: Secondary | ICD-10-CM | POA: Diagnosis not present

## 2023-12-17 DIAGNOSIS — L089 Local infection of the skin and subcutaneous tissue, unspecified: Secondary | ICD-10-CM | POA: Diagnosis not present

## 2023-12-17 DIAGNOSIS — T148XXA Other injury of unspecified body region, initial encounter: Secondary | ICD-10-CM | POA: Diagnosis not present

## 2023-12-17 MED ORDER — DOXYCYCLINE HYCLATE 100 MG PO CAPS: 100.0000 mg | ORAL_CAPSULE | Freq: Two times a day (BID) | ORAL | 0 refills | Status: DC

## 2023-12-17 NOTE — Discharge Instructions (Addendum)
 Sutures on the right lateral leg removed today.  Skin flap remains dusky and likely will begin to slough off over the next several days.  There is evidence of a mild soft tissue infection and we will treat this with antibiotics by mouth.  Recommend the following: Doxycycline 100 mg twice daily for 7 days. Take this with food.   Continue the gentamicin ointment twice daily and cover with a dry dressing.  You may shower and wash the area with soap and water but do not submerge in a pool. May discontinue dressings once there is no open wound. May want to follow-up in 5 to 7 days for wound check Return to urgent care sooner if there is evidence of worsening infection, drainage, pain.

## 2023-12-17 NOTE — ED Provider Notes (Addendum)
 [facility]    CSN: [id number] Arrival date & time: 12/17/23  1016      History   Chief Complaint Chief Complaint  Patient presents with   Suture / Staple Removal    HPI Tracy Hardy is a 68 y.o. female.   68 year old female who returns urgent care today for suture removal.  She suffered a laceration to her right lateral leg and had a skin flap.  This was sutured back in place with the understanding that there is a chance it would not be viable long-term.  She returns today for suture removal but has noticed increased redness and pain in the area.  She did go to her primary care doctor and gentamicin ointment was prescribed.  She denies any fevers or chills.   Suture / Staple Removal Pertinent negatives include no chest pain, no abdominal pain and no shortness of breath.    Past Medical History:  Diagnosis Date   Allergy    GERD (gastroesophageal reflux disease)     Patient Active Problem List   Diagnosis Date Noted   Generalized anxiety disorder 05/04/2023   Palpitations 11/29/2017   Allergic rhinitis 04/07/2016   Anxiety 04/07/2016   GERD (gastroesophageal reflux disease) 04/07/2016    Past Surgical History:  Procedure Laterality Date   CESAREAN SECTION     JOINT REPLACEMENT     hip replacement   TONSILLECTOMY     TUBAL LIGATION      OB History   No obstetric history on file.      Home Medications    Prior to Admission medications   Medication Sig Start Date End Date Taking? Authorizing Provider  doxycycline (VIBRAMYCIN) 100 MG capsule Take 1 capsule (100 mg total) by mouth 2 (two) times daily for 7 days. 12/17/23 12/24/23 Yes Cassidee Deats A, PA-C  ALPRAZolam  (XANAX ) 0.5 MG tablet Take 0.5 mg by mouth 3 (three) times daily.    [provider]  cetirizine  (ZYRTEC ) 10 MG tablet Take 10 mg by mouth daily.    [provider]  Cholecalciferol 10 MCG (400 UNIT) CAPS Vitamin D3 10 mcg (400 unit) capsule    [provider]  FLUoxetine (PROZAC) 20 MG capsule Take 20 mg by mouth daily. 11/12/17   [provider]  fluticasone  (FLONASE ) 50 MCG/ACT nasal spray Place 2 sprays into both nostrils at bedtime.    [provider]    Family History Family History  Problem Relation Age of Onset   Hypertension Mother    Atrial fibrillation Mother    Healthy Father    Parkinson's disease Sister    Healthy Sister     Social History Social History   Tobacco Use   Smoking status: Former    Current packs/day: 0.00    Types: Cigarettes    Quit date: 04/07/1993    Years since quitting: 30.7   Smokeless tobacco: Never  Vaping Use   Vaping status: Never Used  Substance Use Topics   Alcohol use: Yes    Alcohol/week: 1.0 standard drink of alcohol    Types: 1 Cans of beer per week   Drug use: No     Allergies   Codeine   Review of Systems Review of Systems  Constitutional:  Negative for chills and fever.  HENT:  Negative for ear pain and sore throat.   Eyes:  Negative for pain and visual disturbance.  Respiratory:  Negative for cough and shortness of breath.   Cardiovascular:  Negative  for chest pain and palpitations.  Gastrointestinal:  Negative for abdominal pain and vomiting.  Genitourinary:  Negative for dysuria and hematuria.  Musculoskeletal:  Negative for arthralgias and back pain.  Skin:  Positive for color change and wound. Negative for rash.  Neurological:  Negative for seizures and syncope.  All other systems reviewed and are negative.    Physical Exam Triage Vital Signs ED Triage Vitals  Encounter Vitals Group     BP 12/17/23 1038 113/70     Systolic BP Percentile --      Diastolic BP Percentile --      Pulse Rate 12/17/23 1038 (!) 58     Resp 12/17/23 1038 20     Temp --      Temp src --      SpO2 12/17/23 1038 98 %     Weight --      Height --      Head Circumference --      Peak Flow --      Pain Score 12/17/23 1037 1     Pain Loc --      Pain  Education --      Exclude from Growth Chart --    No data found.  Updated Vital Signs BP 113/70   Pulse (!) 58   Resp 20   SpO2 98%   Visual Acuity Right Eye Distance:   Left Eye Distance:   Bilateral Distance:    Right Eye Near:   Left Eye Near:    Bilateral Near:     Physical Exam Vitals and nursing note reviewed.  Constitutional:      General: She is not in acute distress.    Appearance: She is well-developed.  HENT:     Head: Normocephalic.  Cardiovascular:     Rate and Rhythm: Normal rate.  Pulmonary:     Effort: Pulmonary effort is normal. No respiratory distress.     Breath sounds: Normal breath sounds.  Musculoskeletal:        General: No swelling.     Cervical back: Neck supple.  Skin:    General: Skin is warm and dry.     Capillary Refill: Capillary refill takes less than 2 seconds.     Comments: Right lateral calf with sutures in place with a V-shaped incision.  The skin flap remains dusky and is likely nonviable.  Sutures removed and antibiotic ointment and dry dressing placed  Neurological:     Mental Status: She is alert.  Psychiatric:        Mood and Affect: Mood normal.      UC Treatments / Results  Labs (all labs ordered are listed, but only abnormal results are displayed) Labs Reviewed - No data to display  EKG   Radiology No results found.  Procedures Procedures (including critical care time)  Medications Ordered in UC Medications - No data to display  Initial Impression / Assessment and Plan / UC Course  I have reviewed the triage vital signs and the nursing notes.  Pertinent labs & imaging results that were available during my care of the patient were reviewed by me and considered in my medical decision making (see chart for details).     Visit for suture removal  Wound infection   Sutures on the right lateral leg removed today.  Skin flap remains dusky and likely will begin to slough off over the next several days.   There is evidence of a mild soft tissue infection and  we will treat this with antibiotics by mouth.  Recommend the following: Doxycycline 100 mg twice daily for 7 days. Take this with food.   Continue the gentamicin ointment twice daily and cover with a dry dressing.  You may shower and wash the area with soap and water but do not submerge in a pool. May discontinue dressings once there is no open wound. May want to follow-up in 5 to 7 days for wound check Return to urgent care sooner if there is evidence of worsening infection, drainage, pain.  Final Clinical Impressions(s) / UC Diagnoses   Final diagnoses:  Visit for suture removal  Wound infection     Discharge Instructions      Sutures on the right lateral leg removed today.  Skin flap remains dusky and likely will begin to slough off over the next several days.  There is evidence of a mild soft tissue infection and we will treat this with antibiotics by mouth.  Recommend the following: Doxycycline 100 mg twice daily for 7 days. Take this with food.   Continue the gentamicin ointment twice daily and cover with a dry dressing.  You may shower and wash the area with soap and water but do not submerge in a pool. May discontinue dressings once there is no open wound. May want to follow-up in 5 to 7 days for wound check Return to urgent care sooner if there is evidence of worsening infection, drainage, pain.  ED Prescriptions     Medication Sig Dispense Auth. Provider   doxycycline (VIBRAMYCIN) 100 MG capsule Take 1 capsule (100 mg total) by mouth 2 (two) times daily for 7 days. 14 capsule Kreg Pesa, [place]      PDMP not reviewed this encounter.   Kreg Pesa, PA-C 12/17/23 1056    Kreg Pesa, PA-C 12/17/23 1057

## 2023-12-17 NOTE — ED Triage Notes (Signed)
 PT resents for suture removal. The skin around suture site on Rt lower leg is red . Pt reports there is bleeding from site.  Pt went to PCP who ordered gentamicin cream for site.

## 2023-12-24 ENCOUNTER — Encounter (HOSPITAL_COMMUNITY): Payer: Self-pay

## 2023-12-24 ENCOUNTER — Ambulatory Visit (HOSPITAL_COMMUNITY)
Admission: EM | Admit: 2023-12-24 | Discharge: 2023-12-24 | Disposition: A | Discharge status: Home / Self Care | Attending: Emergency Medicine | Admitting: Emergency Medicine

## 2023-12-24 VITALS — BP 110/70 | HR 63 | Temp 97.8°F | Resp 16 | Ht 65.5 in | Wt 142.0 lb

## 2023-12-24 DIAGNOSIS — L089 Local infection of the skin and subcutaneous tissue, unspecified: Secondary | ICD-10-CM

## 2023-12-24 DIAGNOSIS — T148XXA Other injury of unspecified body region, initial encounter: Secondary | ICD-10-CM | POA: Diagnosis not present

## 2023-12-24 DIAGNOSIS — Z5189 Encounter for other specified aftercare: Secondary | ICD-10-CM | POA: Diagnosis not present

## 2023-12-24 MED ORDER — MUPIROCIN CALCIUM 2 % EX CREA: 1.0000 | TOPICAL_CREAM | Freq: Two times a day (BID) | CUTANEOUS | 0 refills | Status: AC

## 2023-12-24 MED ORDER — CEPHALEXIN 500 MG PO CAPS: 500.0000 mg | ORAL_CAPSULE | Freq: Two times a day (BID) | ORAL | 0 refills | Status: AC

## 2023-12-24 NOTE — Discharge Instructions (Addendum)
 Start taking cephalexin  twice daily for 7 days for additional infection coverage. Apply mupirocin cream twice daily to the area. Continue keeping the area clean and dry and covered. Return here if you notice spreading redness, increased swelling, or lots of purulent drainage from the area for reevaluation.

## 2023-12-24 NOTE — ED Provider Notes (Signed)
 [facility]    CSN: [id number] Arrival date & time: 12/24/23  1004      History   Chief Complaint Chief Complaint  Patient presents with   Wound Check    HPI Latondra Gebhart is a 68 y.o. female.   Patient presents for wound check of wound to her right leg that occurred on 5/17.  Patient was seen here on 6/1 for suture removal and was prescribed doxycycline  at that time for wound infection.  Patient states that she completed the doxycycline  yesterday.  Patient states that when she was seen here on 6/1 she had significant amount of redness around the wound and some purulent drainage.  Patient states that the redness around the redness has decreased but she continues to have some mild purulent drainage from the wound.  Denies fever, weakness, and confusion.  The history is provided by the patient and medical records.  Wound Check    Past Medical History:  Diagnosis Date   Allergy    GERD (gastroesophageal reflux disease)     Patient Active Problem List   Diagnosis Date Noted   Generalized anxiety disorder 05/04/2023   Palpitations 11/29/2017   Allergic rhinitis 04/07/2016   Anxiety 04/07/2016   GERD (gastroesophageal reflux disease) 04/07/2016    Past Surgical History:  Procedure Laterality Date   CESAREAN SECTION     JOINT REPLACEMENT     hip replacement   TONSILLECTOMY     TUBAL LIGATION      OB History   No obstetric history on file.      Home Medications    Prior to Admission medications   Medication Sig Start Date End Date Taking? Authorizing Provider  ALPRAZolam  (XANAX ) 0.5 MG tablet Take 0.5 mg by mouth 3 (three) times daily.   Yes [provider]  cephALEXin  (KEFLEX ) 500 MG capsule Take 1 capsule (500 mg total) by mouth 2 (two) times daily for 7 days. 12/24/23 12/31/23 Yes Orest Dygert A, NP  cetirizine  (ZYRTEC ) 10 MG tablet Take 10 mg by mouth daily.   Yes [provider]  Cholecalciferol 10 MCG (400 UNIT) CAPS  Vitamin D3 10 mcg (400 unit) capsule   Yes [provider]  FLUoxetine (PROZAC) 20 MG capsule Take 20 mg by mouth daily. 11/12/17  Yes [provider]  fluticasone  (FLONASE ) 50 MCG/ACT nasal spray Place 2 sprays into both nostrils at bedtime.   Yes [provider]  mupirocin cream (BACTROBAN) 2 % Apply 1 Application topically 2 (two) times daily. 12/24/23  Yes Karon Packer, NP    Family History Family History  Problem Relation Age of Onset   Hypertension Mother    Atrial fibrillation Mother    Healthy Father    Parkinson's disease Sister    Healthy Sister     Social History Social History   Tobacco Use   Smoking status: Former    Current packs/day: 0.00    Types: Cigarettes    Quit date: 04/07/1993    Years since quitting: 30.7   Smokeless tobacco: Never  Vaping Use   Vaping status: Never Used  Substance Use Topics   Alcohol use: Yes    Alcohol/week: 1.0 standard drink of alcohol    Types: 1 Cans of beer per week   Drug use: No     Allergies   Ciprofloxacin and Codeine   Review of Systems Review of Systems  Per HPI  Physical Exam Triage Vital Signs ED Triage Vitals  Encounter Vitals  Group     BP 12/24/23 1017 110/70     Systolic BP Percentile --      Diastolic BP Percentile --      Pulse Rate 12/24/23 1017 63     Resp 12/24/23 1017 16     Temp 12/24/23 1017 97.8 F (36.6 C)     Temp Source 12/24/23 1017 Oral     SpO2 12/24/23 1017 92 %     Weight 12/24/23 1016 142 lb (64.4 kg)     Height 12/24/23 1016 5' 5.5" (1.664 m)     Head Circumference --      Peak Flow --      Pain Score 12/24/23 1015 0     Pain Loc --      Pain Education --      Exclude from Growth Chart --    No data found.  Updated Vital Signs BP 110/70 (BP Location: Right Arm)   Pulse 63   Temp 97.8 F (36.6 C) (Oral)   Resp 16   Ht 5' 5.5" (1.664 m)   Wt 142 lb (64.4 kg)   SpO2 92%   BMI 23.27 kg/m   Visual Acuity Right Eye Distance:   Left Eye  Distance:   Bilateral Distance:    Right Eye Near:   Left Eye Near:    Bilateral Near:     Physical Exam Vitals and nursing note reviewed.  Constitutional:      General: She is awake. She is not in acute distress.    Appearance: Normal appearance. She is well-developed and well-groomed. She is not ill-appearing.  Skin:    General: Skin is warm and dry.     Findings: Erythema and wound present.     Comments: There is a V-shaped incision to the right lateral calf.  The skin flap appears to be nonviable.  There are 2 small open areas with bloody and serosanguineous drainage.  There is some mild erythema surrounding the wound.  Neurological:     Mental Status: She is alert.  Psychiatric:        Behavior: Behavior is cooperative.      UC Treatments / Results  Labs (all labs ordered are listed, but only abnormal results are displayed) Labs Reviewed - No data to display  EKG   Radiology No results found.  Procedures Procedures (including critical care time)  Medications Ordered in UC Medications - No data to display  Initial Impression / Assessment and Plan / UC Course  I have reviewed the triage vital signs and the nursing notes.  Pertinent labs & imaging results that were available during my care of the patient were reviewed by me and considered in my medical decision making (see chart for details).    Patient is well-appearing.  Vitals are stable.  There is a V-shaped incision to the right lateral calf.  The skin flap appears to be nonviable.  There are 2 small open areas with bloody and serosanguineous drainage.  There is some mild erythema surrounding the wound as well.  Prescribed cephalexin  for additional infection coverage.  Prescribed mupirocin cream to apply to the wound.  Discussed proper wound care.  Discussed return precautions. Final Clinical Impressions(s) / UC Diagnoses   Final diagnoses:  Wound infection  Encounter for wound re-check     Discharge  Instructions      Start taking cephalexin  twice daily for 7 days for additional infection coverage. Apply mupirocin cream twice daily to the area. Continue keeping  the area clean and dry and covered. Return here if you notice spreading redness, increased swelling, or lots of purulent drainage from the area for reevaluation.   ED Prescriptions     Medication Sig Dispense Auth. Provider   cephALEXin  (KEFLEX ) 500 MG capsule Take 1 capsule (500 mg total) by mouth 2 (two) times daily for 7 days. 14 capsule Rosevelt Constable, Breanna Mcdaniel A, NP   mupirocin cream (BACTROBAN) 2 % Apply 1 Application topically 2 (two) times daily. 15 g Levora Reas A, NP      PDMP not reviewed this encounter.   Levora Reas A, NP 12/24/23 1055

## 2023-12-24 NOTE — ED Triage Notes (Signed)
 Patient presenting with a wound on the right leg onset 12/02/23. Patient was seen here at the urgent care and here for a 1 week check of the wound.  Patient denies any issues with the wound, states there is still some bleeding but no known signs of infection.  Prescriptions or OTC medications tried: Yes- Antibiotic cream and tablets completed    with significant relief

## 2023-12-27 DIAGNOSIS — M25561 Pain in right knee: Secondary | ICD-10-CM | POA: Diagnosis not present

## 2024-05-14 DIAGNOSIS — R300 Dysuria: Secondary | ICD-10-CM | POA: Diagnosis not present

## 2024-05-14 DIAGNOSIS — R1032 Left lower quadrant pain: Secondary | ICD-10-CM | POA: Diagnosis not present

## 2024-05-14 DIAGNOSIS — M5416 Radiculopathy, lumbar region: Secondary | ICD-10-CM | POA: Diagnosis not present

## 2024-05-29 DIAGNOSIS — M25561 Pain in right knee: Secondary | ICD-10-CM | POA: Diagnosis not present

## 2024-06-12 DIAGNOSIS — M1711 Unilateral primary osteoarthritis, right knee: Secondary | ICD-10-CM | POA: Diagnosis not present

## 2024-06-12 DIAGNOSIS — M25561 Pain in right knee: Secondary | ICD-10-CM | POA: Diagnosis not present

## 2024-06-20 DIAGNOSIS — M1711 Unilateral primary osteoarthritis, right knee: Secondary | ICD-10-CM | POA: Diagnosis not present

## 2024-06-26 DIAGNOSIS — M1711 Unilateral primary osteoarthritis, right knee: Secondary | ICD-10-CM | POA: Diagnosis not present
